# Patient Record
Sex: Female | Born: 1995 | Race: White | Hispanic: No | Marital: Married | Smoking: Never smoker
Health system: Southern US, Community
[De-identification: ages and names within clinical notes are randomized; demographics above are authoritative.]

## PROBLEM LIST (undated history)

## (undated) DIAGNOSIS — M797 Fibromyalgia: Secondary | ICD-10-CM

## (undated) DIAGNOSIS — F419 Anxiety disorder, unspecified: Secondary | ICD-10-CM

## (undated) DIAGNOSIS — R519 Headache, unspecified: Secondary | ICD-10-CM

## (undated) DIAGNOSIS — F32A Depression, unspecified: Secondary | ICD-10-CM

## (undated) HISTORY — DX: Depression, unspecified: F32.A

## (undated) HISTORY — PX: WISDOM TOOTH EXTRACTION: SHX21

## (undated) HISTORY — PX: KNEE SURGERY: SHX244

## (undated) HISTORY — PX: EYE SURGERY: SHX253

## (undated) HISTORY — DX: Anxiety disorder, unspecified: F41.9

---
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
## 2020-12-03 ENCOUNTER — Inpatient Hospital Stay (HOSPITAL_COMMUNITY): Payer: No Typology Code available for payment source

## 2020-12-03 ENCOUNTER — Encounter (HOSPITAL_COMMUNITY): Payer: Self-pay | Admitting: Obstetrics and Gynecology

## 2020-12-03 ENCOUNTER — Other Ambulatory Visit: Payer: Self-pay

## 2020-12-03 ENCOUNTER — Inpatient Hospital Stay (HOSPITAL_COMMUNITY)
Admission: AD | Admit: 2020-12-03 | Discharge: 2020-12-03 | Disposition: A | Discharge status: Home / Self Care | Payer: No Typology Code available for payment source | Attending: Obstetrics and Gynecology | Admitting: Obstetrics and Gynecology

## 2020-12-03 DIAGNOSIS — Z3A01 Less than 8 weeks gestation of pregnancy: Secondary | ICD-10-CM

## 2020-12-03 DIAGNOSIS — O26891 Other specified pregnancy related conditions, first trimester: Secondary | ICD-10-CM | POA: Diagnosis not present

## 2020-12-03 DIAGNOSIS — R1030 Lower abdominal pain, unspecified: Secondary | ICD-10-CM | POA: Diagnosis present

## 2020-12-03 DIAGNOSIS — D72829 Elevated white blood cell count, unspecified: Secondary | ICD-10-CM | POA: Diagnosis not present

## 2020-12-03 DIAGNOSIS — O99111 Other diseases of the blood and blood-forming organs and certain disorders involving the immune mechanism complicating pregnancy, first trimester: Secondary | ICD-10-CM | POA: Insufficient documentation

## 2020-12-03 DIAGNOSIS — O26899 Other specified pregnancy related conditions, unspecified trimester: Secondary | ICD-10-CM

## 2020-12-03 DIAGNOSIS — R109 Unspecified abdominal pain: Secondary | ICD-10-CM

## 2020-12-03 HISTORY — DX: Fibromyalgia: M79.7

## 2020-12-03 LAB — URINALYSIS, ROUTINE W REFLEX MICROSCOPIC
Bilirubin Urine: NEGATIVE
Glucose, UA: NEGATIVE mg/dL
Hgb urine dipstick: NEGATIVE
Ketones, ur: 20 mg/dL — AB
Leukocytes,Ua: NEGATIVE
Nitrite: NEGATIVE
Protein, ur: NEGATIVE mg/dL
Specific Gravity, Urine: 1.003 — ABNORMAL LOW (ref 1.005–1.030)
pH: 6 (ref 5.0–8.0)

## 2020-12-03 LAB — CBC
HCT: 42.3 % (ref 36.0–46.0)
Hemoglobin: 14.5 g/dL (ref 12.0–15.0)
MCH: 30.4 pg (ref 26.0–34.0)
MCHC: 34.3 g/dL (ref 30.0–36.0)
MCV: 88.7 fL (ref 80.0–100.0)
Platelets: 246 10*3/uL (ref 150–400)
RBC: 4.77 MIL/uL (ref 3.87–5.11)
RDW: 12.7 % (ref 11.5–15.5)
WBC: 18.2 10*3/uL — ABNORMAL HIGH (ref 4.0–10.5)
nRBC: 0 % (ref 0.0–0.2)

## 2020-12-03 LAB — WET PREP, GENITAL
Clue Cells Wet Prep HPF POC: NONE SEEN
Sperm: NONE SEEN
Trich, Wet Prep: NONE SEEN
Yeast Wet Prep HPF POC: NONE SEEN

## 2020-12-03 LAB — ABO/RH: ABO/RH(D): A POS

## 2020-12-03 LAB — POCT PREGNANCY, URINE: Preg Test, Ur: POSITIVE — AB

## 2020-12-03 LAB — HCG, QUANTITATIVE, PREGNANCY: hCG, Beta Chain, Quant, S: 9741 m[IU]/mL — ABNORMAL HIGH (ref <5)

## 2020-12-03 NOTE — MAU Provider Note (Signed)
History     CSN: [id number]  Arrival date and time: 12/03/20 1512   Event Date/Time   First Provider Initiated Contact with Patient 12/03/20 1609      Chief Complaint  Patient presents with   Abdominal Pain   Dizziness   HPI 25 yo F G1 at [redacted]w[redacted]d (by LMP of 10/28/2020) with hx of depression (on Zoloft), here after having intermittent lower abdominal pain and cramping without vaginal bleeding throughout the last week. Comes in today after having an episode of severe cramping that also simultaneously led to dizziness and sweating. She denies vaginal bleeding, dysuria, vaginal discharge, trauma. Patient reports currently not having any cramping.   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 No pertinent family history.  Social History   Tobacco Use   Smoking status: Never   Smokeless tobacco: Never  Substance Use Topics   Alcohol use: Not Currently   Drug use: Never    Allergies:  Allergies  Allergen Reactions   Tape Rash    No medications prior to admission.    Review of Systems  Constitutional:  Negative for fever.  Respiratory:  Negative for shortness of breath.   Cardiovascular:  Negative for chest pain.  Gastrointestinal:  Negative for abdominal pain, constipation and diarrhea.  Endocrine: Negative for polyuria.  Genitourinary:  Negative for dysuria.  Musculoskeletal:  Negative for back pain.  Neurological:  Negative for headaches.  Psychiatric/Behavioral:  Negative for agitation.   All other systems reviewed and are negative. Physical Exam   Blood pressure 140/70, pulse 91, temperature 98.1 F (36.7 C), resp. rate 18, height 5\' 2"  (1.575 m), weight 104.3 kg, last menstrual period 10/28/2020.  Physical Exam Vitals and nursing note reviewed.  Constitutional:       Appearance: She is well-developed.  HENT:     Head: Normocephalic.  Cardiovascular:     Rate and Rhythm: Normal rate and regular rhythm.  Pulmonary:     Effort: Pulmonary effort is normal.     Breath sounds: Normal breath sounds.  Abdominal:     General: Abdomen is flat. Bowel sounds are normal. There is no distension.     Palpations: Abdomen is soft.     Comments: No TTP  Skin:    General: Skin is warm.     Capillary Refill: Capillary refill takes less than 2 seconds.  Neurological:     General: No focal deficit present.     Mental Status: She is alert.    MAU Course  MDM 25 yo G1 at [facility] by LMP who presents for intermittent abdominal cramping, NO vaginal bleeding, and one episode of dizziness and diaphoresis while having cramping. Will plan to evaluate and confirm IUP and rule out ectopic. Other differentials include vaginal infection and UTI. Patient currently asymptomatic and hemodynamically stable, mildly hypertensive (140/70), HR of 91, and normal respiratory rate - [facility] OB less than 14 weeks - CBC - RH screen - Swabs for infection: GC/CT, wet mount - UA and urine culture - deferred speculum exam at this time given no bleeding  1730 Labs back, Wet prep without yeast, trich, or clue cells, blood type A+, UA without signs of infection, CBC with isolated elevated WBC (18.2) without any other abnormality.  [place] images reviewed by me and official US report with single intrauterine gestational sac 5wk4days, + YS, no fetal pole at this time.   6:31 PM HCG 9741 which is appropriate for 5 weeks of pregnancy (ref range of 3500-115000)   Assessment and Plan   Abdominal pain in early pregnancy Patient with intermittent abdominal cramping without bleeding. [place] wih single intrauterine gestational sac 5wk4days, + YS, no fetal pole at this time confirming intrauterine pregnancy. HCG is 9741 appropriate for [redacted] weeks gestation (ref 3500-115,000). No evidence of ectopic at this time and  will plan for follow up with community OB (patient already has appointment on 12/19/2020). - message sent to community OB practice regarding visit and plan for repeat US at time of visit   2. Isolated leukocytosis Patient with increased WBC without evidence of infection and also with normal vital signs and all other cell lines normal - Plan for follow up CBC outpatient (included information in message to outpatient provider)  Warner Mccreedy, MD, MPH OB Fellow, Faculty Practice

## 2020-12-03 NOTE — Discharge Instructions (Signed)
You came to the hospital because you had abdominal cramping in pregnancy. We did an ultrasound that showed your pregnancy is in the uterus and you are about 5wks 4 days pregnancy. You will need a repeat ultrasound in 7-10 days. We also collected some swabs and Urine tests and someone will call you if there is anything abnormal.  Please go to your prenatal appointment on 8/12

## 2020-12-03 NOTE — MAU Note (Signed)
Pt presents with cramping (7/10) and dizziness. Denies VB. Pt states she had LMP 6/21. Positive HPT. Unable to urinate at this time. Will try again. Gave instructions to let registration know when she has sample.

## 2020-12-03 NOTE — MAU Note (Signed)
Reports +UPT last week. Started having abd cramping on and off for about a week. Today around 1:30pm she stared feeling dizzy and light headed and sweaty.  Called MD and told to come in.  Feels better now but still having cramping on and off. Denies any vag bleeding or discharge.

## 2020-12-04 LAB — GC/CHLAMYDIA PROBE AMP (~~LOC~~) NOT AT ARMC
Chlamydia: NEGATIVE
Comment: NEGATIVE
Comment: NORMAL
Neisseria Gonorrhea: NEGATIVE

## 2020-12-04 LAB — CULTURE, OB URINE: Culture: <10000 — AB

## 2020-12-25 LAB — OB RESULTS CONSOLE GC/CHLAMYDIA
Chlamydia: NEGATIVE
Gonorrhea: NEGATIVE

## 2020-12-25 LAB — OB RESULTS CONSOLE HIV ANTIBODY (ROUTINE TESTING): HIV: NONREACTIVE

## 2020-12-25 LAB — OB RESULTS CONSOLE ABO/RH: RH Type: POSITIVE

## 2020-12-25 LAB — HEPATITIS C ANTIBODY: HCV Ab: NEGATIVE

## 2020-12-25 LAB — OB RESULTS CONSOLE ANTIBODY SCREEN: Antibody Screen: NEGATIVE

## 2020-12-25 LAB — OB RESULTS CONSOLE RUBELLA ANTIBODY, IGM: Rubella: IMMUNE

## 2020-12-25 LAB — OB RESULTS CONSOLE HEPATITIS B SURFACE ANTIGEN: Hepatitis B Surface Ag: NEGATIVE

## 2020-12-25 LAB — OB RESULTS CONSOLE RPR: RPR: NONREACTIVE

## 2021-02-17 ENCOUNTER — Inpatient Hospital Stay (HOSPITAL_COMMUNITY)
Admission: AD | Admit: 2021-02-17 | Discharge: 2021-02-17 | Disposition: A | Discharge status: Home / Self Care | Payer: No Typology Code available for payment source | Attending: Obstetrics and Gynecology | Admitting: Obstetrics and Gynecology

## 2021-02-17 ENCOUNTER — Other Ambulatory Visit: Payer: Self-pay

## 2021-02-17 ENCOUNTER — Encounter (HOSPITAL_COMMUNITY): Payer: Self-pay | Admitting: Obstetrics and Gynecology

## 2021-02-17 DIAGNOSIS — O219 Vomiting of pregnancy, unspecified: Secondary | ICD-10-CM

## 2021-02-17 DIAGNOSIS — R197 Diarrhea, unspecified: Secondary | ICD-10-CM | POA: Insufficient documentation

## 2021-02-17 DIAGNOSIS — O210 Mild hyperemesis gravidarum: Secondary | ICD-10-CM | POA: Diagnosis present

## 2021-02-17 DIAGNOSIS — O99891 Other specified diseases and conditions complicating pregnancy: Secondary | ICD-10-CM

## 2021-02-17 DIAGNOSIS — Z3A16 16 weeks gestation of pregnancy: Secondary | ICD-10-CM

## 2021-02-17 DIAGNOSIS — E860 Dehydration: Secondary | ICD-10-CM

## 2021-02-17 DIAGNOSIS — R112 Nausea with vomiting, unspecified: Secondary | ICD-10-CM

## 2021-02-17 LAB — URINALYSIS, ROUTINE W REFLEX MICROSCOPIC
Bilirubin Urine: NEGATIVE
Glucose, UA: NEGATIVE mg/dL
Ketones, ur: 80 mg/dL — AB
Nitrite: NEGATIVE
Protein, ur: NEGATIVE mg/dL
Specific Gravity, Urine: 1.015 (ref 1.005–1.030)
pH: 6 (ref 5.0–8.0)

## 2021-02-17 LAB — CBC
HCT: 43.3 % (ref 36.0–46.0)
Hemoglobin: 14.4 g/dL (ref 12.0–15.0)
MCH: 30.1 pg (ref 26.0–34.0)
MCHC: 33.3 g/dL (ref 30.0–36.0)
MCV: 90.4 fL (ref 80.0–100.0)
Platelets: 239 10*3/uL (ref 150–400)
RBC: 4.79 MIL/uL (ref 3.87–5.11)
RDW: 12.7 % (ref 11.5–15.5)
WBC: 20.7 10*3/uL — ABNORMAL HIGH (ref 4.0–10.5)
nRBC: 0 % (ref 0.0–0.2)

## 2021-02-17 LAB — COMPREHENSIVE METABOLIC PANEL
ALT: 17 U/L (ref 0–44)
AST: 19 U/L (ref 15–41)
Albumin: 3.3 g/dL — ABNORMAL LOW (ref 3.5–5.0)
Alkaline Phosphatase: 62 U/L (ref 38–126)
Anion gap: 12 (ref 5–15)
BUN: 9 mg/dL (ref 6–20)
CO2: 22 mmol/L (ref 22–32)
Calcium: 9.3 mg/dL (ref 8.9–10.3)
Chloride: 99 mmol/L (ref 98–111)
Creatinine, Ser: 0.45 mg/dL (ref 0.44–1.00)
GFR, Estimated: >60 mL/min (ref >60)
Glucose, Bld: 90 mg/dL (ref 70–99)
Potassium: 3.7 mmol/L (ref 3.5–5.1)
Sodium: 133 mmol/L — ABNORMAL LOW (ref 135–145)
Total Bilirubin: 0.6 mg/dL (ref 0.3–1.2)
Total Protein: 6.7 g/dL (ref 6.5–8.1)

## 2021-02-17 LAB — LIPASE, BLOOD: Lipase: 31 U/L (ref 11–51)

## 2021-02-17 MED ORDER — LACTATED RINGERS IV BOLUS
1000.0000 mL | Freq: Once | INTRAVENOUS | Status: AC
  Administered 2021-02-17: 1000 mL via INTRAVENOUS

## 2021-02-17 MED ORDER — PROMETHAZINE HCL 25 MG PO TABS: 25.0000 mg | ORAL_TABLET | Freq: Four times a day (QID) | ORAL | 0 refills | Status: AC | PRN

## 2021-02-17 MED ORDER — FAMOTIDINE IN NACL 20-0.9 MG/50ML-% IV SOLN
20.0000 mg | Freq: Once | INTRAVENOUS | Status: AC
  Administered 2021-02-17: 20 mg via INTRAVENOUS
  Filled 2021-02-17: qty 50

## 2021-02-17 MED ORDER — SODIUM CHLORIDE 0.9 % IV SOLN
8.0000 mg | Freq: Once | INTRAVENOUS | Status: AC
  Administered 2021-02-17: 8 mg via INTRAVENOUS
  Filled 2021-02-17: qty 4

## 2021-02-17 NOTE — MAU Provider Note (Addendum)
History     CSN: [id number]  Arrival date and time: 02/17/21 1842  25 y.o. G1 @16 .0 wks presenting with N/V. Reports onset 3 days ago. She had been using but it stopped working. Denies sick contacts or fever. Had diarrhea with sx onset but none since. Denies abdominal pain or VB.    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 No pertinent family history.  Social History   Tobacco Use   Smoking status: Never   Smokeless tobacco: Never  Substance Use Topics   Alcohol use: Not Currently   Drug use: Never    Allergies:  Allergies  Allergen Reactions   Tape Rash    Medications Prior to Admission  Medication Sig Dispense Refill Last Dose   sertraline (ZOLOFT) 50 MG tablet Take 50 mg by mouth daily.   02/17/2021    Review of Systems  Constitutional:  Negative for chills and fever.  Gastrointestinal:  Positive for nausea and vomiting. Negative for abdominal pain.  Genitourinary:  Negative for dysuria, frequency, hematuria, urgency and vaginal bleeding.  Musculoskeletal:  Negative for back pain.  Physical Exam   Blood pressure 128/79, pulse 100, temperature 98.3 F (36.8 C), resp. rate 17, height 5\' 2"  (1.575 m), weight 103.7 kg, last menstrual period 10/28/2020, SpO2 100 %.  Physical Exam Vitals and nursing note reviewed.  Constitutional:      General: She is not in acute distress.    Appearance: Normal appearance.  HENT:     Head: Normocephalic and atraumatic.  Cardiovascular:     Rate and Rhythm: Normal rate.  Pulmonary:     Effort: Pulmonary effort is normal. No respiratory  distress.  Musculoskeletal:        General: Normal range of motion.     Cervical back: Normal range of motion.  Neurological:     General: No focal deficit present.     Mental Status: She is alert and oriented to person, place, and time.  Psychiatric:        Mood and Affect: Mood normal.        Behavior: Behavior normal.  FHT 145  Results for orders placed or performed during the hospital encounter of 02/17/21 (from the past 24 hour(s))  Urinalysis, Routine w reflex microscopic Urine, Clean Catch     Status: Abnormal   Collection Time: 02/17/21  7:05 PM  Result Value Ref Range   Color, Urine YELLOW YELLOW   APPearance HAZY (A) CLEAR   Specific Gravity, Urine 1.015 1.005 - 1.030   pH 6.0 5.0 - 8.0   Glucose, UA NEGATIVE NEGATIVE mg/dL   Hgb urine dipstick SMALL (A) NEGATIVE   Bilirubin Urine NEGATIVE NEGATIVE   Ketones, ur 80 (A) NEGATIVE mg/dL   Protein, ur  NEGATIVE NEGATIVE mg/dL   Nitrite NEGATIVE NEGATIVE   Leukocytes,Ua LARGE (A) NEGATIVE   RBC / HPF 0-5 0 - 5 RBC/hpf   WBC, UA 6-10 0 - 5 WBC/hpf   Bacteria, UA FEW (A) NONE SEEN   Squamous Epithelial / LPF 6-10 0 - 5   Mucus PRESENT   CBC     Status: Abnormal   Collection Time: 02/17/21  8:30 PM  Result Value Ref Range   WBC 20.7 (H) 4.0 - 10.5 K/uL   RBC 4.79 3.87 - 5.11 MIL/uL   Hemoglobin 14.4 12.0 - 15.0 g/dL   HCT 05.3 97.6 - 73.4 %   MCV 90.4 80.0 - 100.0 fL   MCH 30.1 26.0 - 34.0 pg   MCHC 33.3 30.0 - 36.0 g/dL   RDW 19.3 79.0 - 24.0 %   Platelets 239 150 - 400 K/uL   nRBC 0.0 0.0 - 0.2 %  Comprehensive metabolic panel     Status: Abnormal   Collection Time: 02/17/21  8:30 PM  Result Value Ref Range   Sodium 133 (L) 135 - 145 mmol/L   Potassium 3.7 3.5 - 5.1 mmol/L   Chloride 99 98 - 111 mmol/L   CO2 22 22 - 32 mmol/L   Glucose, Bld 90 70 - 99 mg/dL   BUN 9 6 - 20 mg/dL   Creatinine, Ser 9.73 0.44 - 1.00 mg/dL   Calcium 9.3 8.9 - 53.2 mg/dL   Total Protein 6.7 6.5 - 8.1 g/dL   Albumin 3.3 (L) 3.5 -  5.0 g/dL   AST 19 15 - 41 U/L   ALT 17 0 - 44 U/L   Alkaline Phosphatase 62 38 - 126 U/L   Total Bilirubin 0.6 0.3 - 1.2 mg/dL   GFR, Estimated >99 >24 mL/min   Anion gap 12 5 - 15  Lipase, blood     Status: None   Collection Time: 02/17/21  8:30 PM  Result Value Ref Range   Lipase 31 11 - 51 U/L   MAU Course  Procedures LR Zofran Pepcid  MDM Prenatal records reviewed: pregnancy complicated by chronic HAs, fibromyalgia, and depression. Labs ordered and reviewed. Elevated WBCs, may have GI virus although no longer having diarrhea.  Transfer of care given to Lorenda Peck, CNM  02/17/2021 10:09 PM   2230hrs:   States feels better and wants to go home  Assessment and Plan  A:  Single IUP at [redacted]w[redacted]d       Nausea/vomiting       Diarrhea  P:   Discharge home        Rx Phenergan for nausea        Advance diet as tolerated       Followup in office        Encouraged to return if she develops worsening of symptoms, increase in pain, fever, or other concerning symptoms.   Aviva Signs, CNM

## 2021-02-17 NOTE — MAU Note (Signed)
.  [name] is a 25 y.o. at [redacted]w[redacted]d here in MAU reporting: n/v since Saturday. States she has not been able to keep anything down. Denies pain. Takes Bonjesta.   Vitals:   02/17/21 1854  BP: 128/87  Pulse: 96  Resp: 17  Temp: 98.3 F (36.8 C)  SpO2: 97%     FHT:145 Lab orders placed from triage:  UA

## 2021-02-19 LAB — CULTURE, OB URINE

## 2021-05-14 ENCOUNTER — Inpatient Hospital Stay (HOSPITAL_COMMUNITY)
Admission: AD | Admit: 2021-05-14 | Discharge: 2021-05-14 | Disposition: A | Discharge status: Home / Self Care | Payer: No Typology Code available for payment source | Attending: Obstetrics and Gynecology | Admitting: Obstetrics and Gynecology

## 2021-05-14 ENCOUNTER — Encounter (HOSPITAL_COMMUNITY): Payer: Self-pay | Admitting: Obstetrics and Gynecology

## 2021-05-14 ENCOUNTER — Other Ambulatory Visit: Payer: Self-pay

## 2021-05-14 DIAGNOSIS — Z3689 Encounter for other specified antenatal screening: Secondary | ICD-10-CM | POA: Diagnosis present

## 2021-05-14 DIAGNOSIS — N898 Other specified noninflammatory disorders of vagina: Secondary | ICD-10-CM

## 2021-05-14 DIAGNOSIS — Z3A28 28 weeks gestation of pregnancy: Secondary | ICD-10-CM

## 2021-05-14 DIAGNOSIS — O26893 Other specified pregnancy related conditions, third trimester: Secondary | ICD-10-CM | POA: Diagnosis not present

## 2021-05-14 LAB — URINALYSIS, ROUTINE W REFLEX MICROSCOPIC
Bilirubin Urine: NEGATIVE
Glucose, UA: NEGATIVE mg/dL
Hgb urine dipstick: NEGATIVE
Ketones, ur: NEGATIVE mg/dL
Leukocytes,Ua: NEGATIVE
Nitrite: NEGATIVE
Protein, ur: NEGATIVE mg/dL
Specific Gravity, Urine: 1.029 (ref 1.005–1.030)
pH: 6 (ref 5.0–8.0)

## 2021-05-14 LAB — WET PREP, GENITAL
Clue Cells Wet Prep HPF POC: NONE SEEN
Sperm: NONE SEEN
Trich, Wet Prep: NONE SEEN
WBC, Wet Prep HPF POC: <10 (ref <10)
Yeast Wet Prep HPF POC: NONE SEEN

## 2021-05-14 LAB — POCT FERN TEST: POCT Fern Test: NEGATIVE

## 2021-05-14 NOTE — MAU Note (Signed)
About 1745 I felt the baby move and I leaked some fld. I have not leaked anymore since then. Mild cramping in lower abd. Good FM

## 2021-05-14 NOTE — MAU Provider Note (Signed)
History   [id number]   Chief Complaint  Patient presents with   Vaginal Discharge    HPI Kristine Elliott is a 26 y.o. female  G1P0 @28 .2 wks here with report of leaking fluid around 1745 after she felt the baby move.  Leaking of fluid has not continued. Denies vaginal itching or malodor. Was treated for a yeast infection in November. Pt reports no contractions. She denies vaginal bleeding. Last intercourse was not recent. She reports good fetal movement. All other systems negative.    Patient's last menstrual period was 10/28/2020.  OB History  Gravida Para Term Preterm AB Living  1            SAB IAB Ectopic Multiple Live Births               # Outcome Date GA Lbr Len/2nd Weight Sex Delivery Anes PTL Lv  1 Current             Past Medical History:  Diagnosis Date   Fibromyalgia     History reviewed. No pertinent family history.  Social History   Socioeconomic History   Marital status: Married    Spouse name: Not on file   Number of children: Not on file   Years of education: Not on file   Highest education level: Not on file  Occupational History   Not on file  Tobacco Use   Smoking status: Never   Smokeless tobacco: Never  Substance and Sexual Activity   Alcohol use: Not Currently   Drug use: Never   Sexual activity: Yes    Birth control/protection: None  Other Topics Concern   Not on file  Social History Narrative   Not on file   Social Determinants of Health   Financial Resource Strain: Not on file  Food Insecurity: Not on file  Transportation Needs: Not on file  Physical Activity: Not on file  Stress: Not on file  Social Connections: Not on file    Allergies  Allergen Reactions   Tape Rash    No current facility-administered medications on file prior to encounter.   Current Outpatient Medications on File Prior to Encounter  Medication Sig Dispense Refill   Prenatal Vit-Fe Fumarate-FA (MULTIVITAMIN-PRENATAL) 27-0.8 MG TABS tablet Take 1  tablet by mouth daily at 12 noon.     promethazine (PHENERGAN) 25 MG tablet Take 1 tablet (25 mg total) by mouth every 6 (six) hours as needed for nausea or vomiting. 30 tablet 0   sertraline (ZOLOFT) 50 MG tablet Take 50 mg by mouth daily.       Review of Systems  Gastrointestinal:  Negative for abdominal pain.  Genitourinary:  Positive for vaginal discharge. Negative for vaginal bleeding.    Physical Exam   Vitals:   05/14/21 1941 05/14/21 1943 05/14/21 2015 05/14/21 2035  BP:  124/87 121/75   Pulse: 88  (!) 108   Resp: 18  18   Temp: 98.2 F (36.8 C)   97.9 F (36.6 C)  TempSrc:    Oral  SpO2: 99%  98%   Weight: 108 kg     Height: 5\' 2"  (1.575 m)       Physical Exam Vitals and nursing note reviewed. Exam conducted with a chaperone present.  Constitutional:      General: She is not in acute distress.    Appearance: Normal appearance.  HENT:     Head: Normocephalic and atraumatic.  Pulmonary:     Effort: Pulmonary effort is  normal. No respiratory distress.  Abdominal:     Palpations: Abdomen is soft.     Tenderness: There is no abdominal tenderness.  Genitourinary:    Comments: SSE: no pool, thick curdy discharge  Musculoskeletal:        General: Normal range of motion.     Cervical back: Normal range of motion.  Skin:    General: Skin is warm and dry.  Neurological:     General: No focal deficit present.     Mental Status: She is alert and oriented to person, place, and time.  Psychiatric:        Mood and Affect: Mood normal.        Behavior: Behavior normal.  EFM: 130 bpm, mod variability, + accels, variable decels Toco: none  Results for orders placed or performed during the hospital encounter of 05/14/21 (from the past 24 hour(s))  Wet prep, genital     Status: None   Collection Time: 05/14/21  7:27 PM  Result Value Ref Range   Yeast Wet Prep HPF POC NONE SEEN NONE SEEN   Trich, Wet Prep NONE SEEN NONE SEEN   Clue Cells Wet Prep HPF POC NONE SEEN  NONE SEEN   WBC, Wet Prep HPF POC <10 <10   Sperm NONE SEEN   Urinalysis, Routine w reflex microscopic Urine, Clean Catch     Status: Abnormal   Collection Time: 05/14/21  7:50 PM  Result Value Ref Range   Color, Urine AMBER (A) YELLOW   APPearance HAZY (A) CLEAR   Specific Gravity, Urine 1.029 1.005 - 1.030   pH 6.0 5.0 - 8.0   Glucose, UA NEGATIVE NEGATIVE mg/dL   Hgb urine dipstick NEGATIVE NEGATIVE   Bilirubin Urine NEGATIVE NEGATIVE   Ketones, ur NEGATIVE NEGATIVE mg/dL   Protein, ur NEGATIVE NEGATIVE mg/dL   Nitrite NEGATIVE NEGATIVE   Leukocytes,Ua NEGATIVE NEGATIVE  Fern Test     Status: None   Collection Time: 05/14/21  9:50 PM  Result Value Ref Range   POCT Fern Test Negative = intact amniotic membranes    MAU Course  Procedures  MDM Prenatal records reviewed. Labs ordered and reviewed. Had ~12 min episode of maternal tachycardia w/HR 120-140, felt lightheaded, no syncope. RN had pt take some slow deep breaths and HR normalized. Shortly after return to baseline FHR had a few variables. IVF started and pt repositioned to left lateral.  Prolonged monitoring was reassuring and no further episodes of maternal tachycardia or FHR decels. Pt feels well. No signs of SROM. Stable for discharge home.  Assessment and Plan   1. [redacted] weeks gestation of pregnancy   2. NST (non-stress test) reactive   3. Vaginal discharge during pregnancy in third trimester    Discharge home Follow up at [facility] as scheduled PTL precautions  Allergies as of 05/14/2021       Reactions   Tape Rash        Medication List     TAKE these medications    multivitamin-prenatal 27-0.8 MG Tabs tablet Take 1 tablet by mouth daily at 12 noon.   promethazine 25 MG tablet Commonly known as: PHENERGAN Take 1 tablet (25 mg total) by mouth every 6 (six) hours as needed for nausea or vomiting.   sertraline 50 MG tablet Commonly known as: ZOLOFT Take 50 mg by mouth daily.        Julianne Handler, CNM 05/14/2021 9:11 PM

## 2021-05-15 LAB — GC/CHLAMYDIA PROBE AMP (~~LOC~~) NOT AT ARMC
Chlamydia: NEGATIVE
Comment: NEGATIVE
Comment: NORMAL
Neisseria Gonorrhea: NEGATIVE

## 2021-07-08 LAB — OB RESULTS CONSOLE GBS: GBS: NEGATIVE

## 2021-07-23 ENCOUNTER — Telehealth (HOSPITAL_COMMUNITY): Payer: Self-pay | Admitting: *Deleted

## 2021-07-23 ENCOUNTER — Encounter (HOSPITAL_COMMUNITY): Payer: Self-pay | Admitting: *Deleted

## 2021-07-23 NOTE — Telephone Encounter (Signed)
Preadmission screen  

## 2021-07-27 ENCOUNTER — Telehealth (HOSPITAL_COMMUNITY): Payer: Self-pay | Admitting: *Deleted

## 2021-07-27 ENCOUNTER — Other Ambulatory Visit: Payer: Self-pay | Admitting: Obstetrics & Gynecology

## 2021-07-27 ENCOUNTER — Encounter (HOSPITAL_COMMUNITY): Payer: Self-pay | Admitting: Obstetrics & Gynecology

## 2021-07-27 NOTE — Telephone Encounter (Signed)
Preadmission screen  

## 2021-07-28 ENCOUNTER — Inpatient Hospital Stay (HOSPITAL_COMMUNITY): Payer: No Typology Code available for payment source | Admitting: Anesthesiology

## 2021-07-28 ENCOUNTER — Other Ambulatory Visit: Payer: Self-pay

## 2021-07-28 ENCOUNTER — Encounter (HOSPITAL_COMMUNITY): Payer: Self-pay | Admitting: Obstetrics and Gynecology

## 2021-07-28 ENCOUNTER — Inpatient Hospital Stay (HOSPITAL_COMMUNITY)
Admission: AD | Admit: 2021-07-28 | Discharge: 2021-07-31 | DRG: 787 | Disposition: A | Discharge status: Home / Self Care | Payer: No Typology Code available for payment source | Attending: Obstetrics and Gynecology | Admitting: Obstetrics and Gynecology

## 2021-07-28 DIAGNOSIS — O99214 Obesity complicating childbirth: Secondary | ICD-10-CM | POA: Diagnosis present

## 2021-07-28 DIAGNOSIS — F411 Generalized anxiety disorder: Secondary | ICD-10-CM | POA: Diagnosis present

## 2021-07-28 DIAGNOSIS — O3663X0 Maternal care for excessive fetal growth, third trimester, not applicable or unspecified: Secondary | ICD-10-CM | POA: Diagnosis present

## 2021-07-28 DIAGNOSIS — O4292 Full-term premature rupture of membranes, unspecified as to length of time between rupture and onset of labor: Principal | ICD-10-CM | POA: Diagnosis present

## 2021-07-28 DIAGNOSIS — O99344 Other mental disorders complicating childbirth: Secondary | ICD-10-CM | POA: Diagnosis present

## 2021-07-28 DIAGNOSIS — O358XX0 Maternal care for other (suspected) fetal abnormality and damage, not applicable or unspecified: Secondary | ICD-10-CM | POA: Diagnosis present

## 2021-07-28 DIAGNOSIS — Z3A39 39 weeks gestation of pregnancy: Secondary | ICD-10-CM

## 2021-07-28 DIAGNOSIS — O9912 Other diseases of the blood and blood-forming organs and certain disorders involving the immune mechanism complicating childbirth: Secondary | ICD-10-CM | POA: Diagnosis present

## 2021-07-28 DIAGNOSIS — D72829 Elevated white blood cell count, unspecified: Secondary | ICD-10-CM | POA: Diagnosis present

## 2021-07-28 LAB — CBC
HCT: 35.3 % — ABNORMAL LOW (ref 36.0–46.0)
Hemoglobin: 11.3 g/dL — ABNORMAL LOW (ref 12.0–15.0)
MCH: 25.6 pg — ABNORMAL LOW (ref 26.0–34.0)
MCHC: 32 g/dL (ref 30.0–36.0)
MCV: 80 fL (ref 80.0–100.0)
Platelets: 266 10*3/uL (ref 150–400)
RBC: 4.41 MIL/uL (ref 3.87–5.11)
RDW: 14.3 % (ref 11.5–15.5)
WBC: 15.4 10*3/uL — ABNORMAL HIGH (ref 4.0–10.5)
nRBC: 0 % (ref 0.0–0.2)

## 2021-07-28 LAB — URINALYSIS, ROUTINE W REFLEX MICROSCOPIC
Bilirubin Urine: NEGATIVE
Glucose, UA: NEGATIVE mg/dL
Ketones, ur: NEGATIVE mg/dL
Nitrite: NEGATIVE
Protein, ur: NEGATIVE mg/dL
Specific Gravity, Urine: 1.024 (ref 1.005–1.030)
pH: 6 (ref 5.0–8.0)

## 2021-07-28 LAB — TYPE AND SCREEN
ABO/RH(D): A POS
Antibody Screen: NEGATIVE

## 2021-07-28 LAB — RPR: RPR Ser Ql: NONREACTIVE

## 2021-07-28 LAB — POCT FERN TEST: POCT Fern Test: POSITIVE

## 2021-07-28 MED ORDER — EPHEDRINE 5 MG/ML INJ: 10.0000 mg | INTRAVENOUS | Status: DC | PRN

## 2021-07-28 MED ORDER — LIDOCAINE HCL (PF) 1 % IJ SOLN: 30.0000 mL | INTRAMUSCULAR | Status: DC | PRN

## 2021-07-28 MED ORDER — FENTANYL-BUPIVACAINE-NACL 0.5-0.125-0.9 MG/250ML-% EP SOLN
12.0000 mL/h | EPIDURAL | Status: DC | PRN
  Administered 2021-07-28: 12 mL/h via EPIDURAL
  Filled 2021-07-28: qty 250

## 2021-07-28 MED ORDER — SERTRALINE HCL 100 MG PO TABS
100.0000 mg | ORAL_TABLET | Freq: Every day | ORAL | Status: DC
  Administered 2021-07-28: 100 mg via ORAL
  Filled 2021-07-28 (×2): qty 1

## 2021-07-28 MED ORDER — OXYTOCIN-SODIUM CHLORIDE 30-0.9 UT/500ML-% IV SOLN
1.0000 m[IU]/min | INTRAVENOUS | Status: DC
  Administered 2021-07-28: 2 m[IU]/min via INTRAVENOUS
  Filled 2021-07-28: qty 500

## 2021-07-28 MED ORDER — OXYCODONE-ACETAMINOPHEN 5-325 MG PO TABS: 1.0000 | ORAL_TABLET | ORAL | Status: DC | PRN

## 2021-07-28 MED ORDER — FENTANYL CITRATE (PF) 100 MCG/2ML IJ SOLN
100.0000 ug | INTRAMUSCULAR | Status: DC | PRN
  Administered 2021-07-28 (×2): 100 ug via INTRAVENOUS
  Filled 2021-07-28 (×2): qty 2

## 2021-07-28 MED ORDER — FLEET ENEMA 7-19 GM/118ML RE ENEM: 1.0000 | ENEMA | RECTAL | Status: DC | PRN

## 2021-07-28 MED ORDER — LACTATED RINGERS IV SOLN: 500.0000 mL | INTRAVENOUS | Status: DC | PRN

## 2021-07-28 MED ORDER — ONDANSETRON HCL 4 MG/2ML IJ SOLN: 4.0000 mg | Freq: Four times a day (QID) | INTRAMUSCULAR | Status: DC | PRN

## 2021-07-28 MED ORDER — PHENYLEPHRINE 40 MCG/ML (10ML) SYRINGE FOR IV PUSH (FOR BLOOD PRESSURE SUPPORT): 80.0000 ug | PREFILLED_SYRINGE | INTRAVENOUS | Status: DC | PRN

## 2021-07-28 MED ORDER — LACTATED RINGERS IV SOLN
500.0000 mL | Freq: Once | INTRAVENOUS | Status: AC
  Administered 2021-07-28: 500 mL via INTRAVENOUS

## 2021-07-28 MED ORDER — SOD CITRATE-CITRIC ACID 500-334 MG/5ML PO SOLN
30.0000 mL | ORAL | Status: DC | PRN
  Administered 2021-07-29: 30 mL via ORAL
  Filled 2021-07-28: qty 30

## 2021-07-28 MED ORDER — OXYTOCIN-SODIUM CHLORIDE 30-0.9 UT/500ML-% IV SOLN: 2.5000 [IU]/h | INTRAVENOUS | Status: DC

## 2021-07-28 MED ORDER — DIPHENHYDRAMINE HCL 50 MG/ML IJ SOLN: 12.5000 mg | INTRAMUSCULAR | Status: DC | PRN

## 2021-07-28 MED ORDER — LACTATED RINGERS IV SOLN: INTRAVENOUS | Status: DC

## 2021-07-28 MED ORDER — ACETAMINOPHEN 325 MG PO TABS
650.0000 mg | ORAL_TABLET | ORAL | Status: DC | PRN
  Administered 2021-07-28: 650 mg via ORAL
  Filled 2021-07-28: qty 2

## 2021-07-28 MED ORDER — OXYCODONE-ACETAMINOPHEN 5-325 MG PO TABS: 2.0000 | ORAL_TABLET | ORAL | Status: DC | PRN

## 2021-07-28 MED ORDER — LIDOCAINE HCL (PF) 1 % IJ SOLN
INTRAMUSCULAR | Status: DC | PRN
  Administered 2021-07-28 (×2): 5 mL via EPIDURAL

## 2021-07-28 MED ORDER — TERBUTALINE SULFATE 1 MG/ML IJ SOLN: 0.2500 mg | Freq: Once | INTRAMUSCULAR | Status: DC | PRN

## 2021-07-28 MED ORDER — OXYTOCIN BOLUS FROM INFUSION: 333.0000 mL | Freq: Once | INTRAVENOUS | Status: DC

## 2021-07-28 NOTE — H&P (Addendum)
Kristine Elliott is a 26 y.o. female presenting for PROM clear fluid.  Scheduled for elective IOL tonight.  [place] last week EFW 8+12#.  GBS neg.  Not diabetic.  Left renal pylectasis (UTD 2) ? ?OB History   ? ? Gravida  ?1  ? Para  ?   ? Term  ?   ? Preterm  ?   ? AB  ?   ? Living  ?   ?  ? ? SAB  ?   ? IAB  ?   ? Ectopic  ?   ? Multiple  ?   ? Live Births  ?   ?   ?  ?  ? ?Past Medical History:  ?Diagnosis Date  ? Anxiety   ? Depression   ? Fibromyalgia   ? Headache   ? ?Past Surgical History:  ?Procedure Laterality Date  ? EYE SURGERY  1999  ? 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 KNEE SURGERY Left 2013  ? WISDOM TOOTH EXTRACTION    ? ?Family History: family history includes Diabetes in her father; Hyperlipidemia in her father; Hypertension in her father; Thrombosis in her maternal grandmother and paternal grandfather. ?Social History:  reports that she has never smoked. She has never used smokeless tobacco. She reports that she does not currently use alcohol. She reports that she does not use drugs. ? ? ?  ?Maternal Diabetes: No ?Genetic Screening: Normal ?Maternal Ultrasounds/Referrals: Fetal Kidney Anomalies normal kidney but Left UTD 2 ?Fetal Ultrasounds or other Referrals:  None ?Maternal Substance Abuse:  No ?Significant Maternal Medications:  None ?Significant Maternal Lab Results:  Group B Strep negative ?Other Comments:  None ? ?Review of Systems ?History ?Dilation: 1 ?Effacement (%): 50 ?Station: -3 ?Exam by:: Joylene Igo RN ?Blood pressure 128/77, pulse 81, temperature 98.2 ?F (36.8 ?C), temperature source Oral, resp. rate 18, height 5\' 2"  (1.575 m), weight 116.2 kg, last menstrual period 10/28/2020, SpO2 99 %. ?Exam ?Physical Exam  ?Vitals and nursing note reviewed. Exam conducted with a chaperone present.  ?Constitutional:   ?   Appearance: Normal appearance.  ?HENT:  ?   Head: Normocephalic.  ?Eyes:  ?   Pupils: Pupils are equal, round, and reactive to light.   ?Cardiovascular:  ?   Rate and Rhythm: Normal rate and regular rhythm.  ?   Pulses: Normal pulses.  ?Abdominal:  ?   General: Abdomen is Gravid, nontender ?Neurological:  ?   Mental Status: She is alert.  ?Prenatal labs: ?ABO, Rh: --/--/A POS (03/21 [id number]) ?Antibody: NEG (03/21 0751) ?Rubella: Immune (08/18 0000) ?RPR: Nonreactive (08/18 0000)  ?HBsAg: Negative (08/18 0000)  ?HIV: Non-reactive (08/18 0000)  ?GBS: Negative/-- (03/01 0000)  ? ?Assessment/Plan: ?IUP at term ?PROM - Augment with pitocin.  Anticipate SVD ?Macrosomia by last week US/ no diabetes.  Will  monitor progression in labor  ? ?Luz Lex ?07/28/2021, 11:21 AM ? ? ? ? ?

## 2021-07-28 NOTE — Progress Notes (Signed)
Kristine Elliott is a 26 y.o. G1P0 at [redacted]w[redacted]d by LMP admitted for rupture of membranes ? ?Subjective: ? ? ?Objective: ?BP 114/63   Pulse 68   Temp 98.4 ?F (36.9 ?C) (Oral)   Resp 18   Ht 5\' 2"  (1.575 m)   Wt 116.2 kg   LMP 10/28/2020   SpO2 99%   BMI 46.86 kg/m?  ?No intake/output data recorded. ?No intake/output data recorded. ? ?FHT:  FHR: 140 bpm, variability: moderate,  accelerations:  Present,  decelerations:  Absent ?UC:   regular, every 2 minutes ?SVE:   Dilation: 4 ?Effacement (%): 80 ?Station: -2 ?Exam by:: [email or web address], MD ? ?Labs: ?Lab Results  ?Component Value Date  ? WBC 15.4 (H) 07/28/2021  ? HGB 11.3 (L) 07/28/2021  ? HCT 35.3 (L) 07/28/2021  ? MCV 80.0 07/28/2021  ? PLT 266 07/28/2021  ? ? ?Assessment / Plan: ?Arrest in active phase of labor ? ?Labor:  adequate labor pattern and by IUPC for 6 plus hours ?Preeclampsia:  no signs or symptoms of toxicity ?Fetal Wellbeing:  Category I ?Pain Control:  Epidural ?I/D:  n/a ?Anticipated MOD:   I recommend primary cesarean section based on arrest of dilation for 4 hours and protracted labor previously .  Also, Macrosomia by 07/30/2021. ?Kristine Elliott refuses c/s at this time.  I discussed the definition of arrest of dilation and risk associated with continuing to labor including fetal intolerance to labor and/or chorio.  She replies " I don't want one" and shares her understanding of risks.  I asked her when she will be ready for a c/s and she replies " I don't know.  I just don't want one".  We agreed to recheck her in 2 hours and re discuss.  The nurse and patient discussed waiting for Dr Lillia Abed to come on at 7:30am (9 hours) and I strongly recommended not waiting that long if not progressing.  At this time FHR is Cat 1 ? ? ?Kristine Elliott ?07/28/2021, 10:11 PM ? ? ?

## 2021-07-28 NOTE — MAU Note (Signed)
.  Kristine Elliott is a 26 y.o. at [redacted]w[redacted]d here in MAU reporting: water broke @ 0600, reports fluid is clear, but had pinkish tint.  Reports irregular ctxs ? ?Onset of complaint: 0600 ?Pain score: 0 ?Vitals:  ? 07/28/21 0720  ?BP: (!) 135/98  ?Pulse: (!) 112  ?Resp: 20  ?Temp: 97.8 ?F (36.6 ?C)  ?SpO2: 99%  ?   ?FHT: 127 bpm w/ +FM.  Denies VB ?Lab orders placed from triage:    ?

## 2021-07-28 NOTE — Plan of Care (Signed)
?  Problem: Education: ?Goal: Ability to make informed decisions regarding treatment and plan of care will improve ?Outcome: Progressing ?  ?Problem: Coping: ?Goal: Ability to verbalize concerns and feelings about labor and delivery will improve ?Outcome: Progressing ?  ?Problem: Pain Management: ?Goal: Relief or control of pain from uterine contractions will improve ?Outcome: Progressing ?  ?Problem: Education: ?Goal: Knowledge of General Education information will improve ?Description: Including pain rating scale, medication(s)/side effects and non-pharmacologic comfort measures ?Outcome: Progressing ?  ?

## 2021-07-28 NOTE — Anesthesia Procedure Notes (Signed)
Epidural ?Patient location during procedure: OB ?Start time: 07/28/2021 2:15 PM ?End time: 07/28/2021 2:28 PM ? ?Staffing ?Anesthesiologist: Lucretia Kern, MD ?Performed: anesthesiologist  ? ?Preanesthetic Checklist ?Completed: patient identified, IV checked, risks and benefits discussed, monitors and equipment checked, pre-op evaluation and timeout performed ? ?Epidural ?Patient position: sitting ?Prep: DuraPrep ?Patient monitoring: heart rate, continuous pulse ox and blood pressure ?Approach: midline ?Location: L3-L4 ?Injection technique: LOR air ? ?Needle:  ?Needle type: Tuohy  ?Needle gauge: 17 G ?Needle length: 9 cm ?Needle insertion depth: 8 cm ?Catheter type: closed end flexible ?Catheter size: 19 Gauge ?Catheter at skin depth: 13 cm ?Test dose: negative ? ?Assessment ?Events: blood not aspirated, injection not painful, no injection resistance, no paresthesia and negative IV test ? ?Additional Notes ?Reason for block:procedure for pain ? ? ? ?

## 2021-07-28 NOTE — Anesthesia Preprocedure Evaluation (Addendum)
Anesthesia Evaluation  ?Patient identified by MRN, date of birth, ID band ?Patient awake ? ? ? ?Reviewed: ?Allergy & Precautions, H&P , NPO status , Patient's Chart, lab work & pertinent test results, reviewed documented beta blocker date and time  ? ?History of Anesthesia Complications ?Negative for: history of anesthetic complications ? ?Airway ?Mallampati: II ? ?TM Distance: >3 FB ? ? ? ? Dental ?no notable dental hx. ? ?  ?Pulmonary ?neg pulmonary ROS,  ?  ?Pulmonary exam normal ? ? ? ? ? ? ? Cardiovascular ?negative cardio ROS ? ? ?Rhythm:regular Rate:Normal ? ? ?  ?Neuro/Psych ?Anxiety Depression negative neurological ROS ?   ? GI/Hepatic ?negative GI ROS, Neg liver ROS,   ?Endo/Other  ?Morbid obesity ? Renal/GU ?  ? ?  ?Musculoskeletal ? ?(+) Fibromyalgia - ? Abdominal ?(+) + obese,   ?Peds ? Hematology ?negative hematology ROS ?(+)   ?Anesthesia Other Findings ? ? Reproductive/Obstetrics ?(+) Pregnancy ? ?  ? ? ? ? ? ? ? ? ? ? ? ? ? ?  ?  ? ? ? ? ? ? ? ?Anesthesia Physical ?Anesthesia Plan ? ?ASA: 3 and emergent ? ?Anesthesia Plan: Epidural  ? ?Post-op Pain Management: Epidural*  ? ?Induction: Intravenous ? ?PONV Risk Score and Plan: 4 or greater and Treatment may vary due to age or medical condition and Scopolamine patch - Pre-op ? ?Airway Management Planned: Natural Airway ? ?Additional Equipment:  ? ?Intra-op Plan:  ? ?Post-operative Plan:  ? ?Informed Consent: I have reviewed the patients History and Physical, chart, labs and discussed the procedure including the risks, benefits and alternatives for the proposed anesthesia with the patient or authorized representative who has indicated his/her understanding and acceptance.  ? ? ? ?Dental advisory given ? ?Plan Discussed with: CRNA and Anesthesiologist ? ?Anesthesia Plan Comments:   ? ? ? ? ? ?Anesthesia Quick Evaluation ? ?

## 2021-07-29 ENCOUNTER — Encounter (HOSPITAL_COMMUNITY)
Admission: AD | Disposition: A | Discharge status: Home / Self Care | Payer: Self-pay | Source: Home / Self Care | Attending: Obstetrics and Gynecology

## 2021-07-29 ENCOUNTER — Inpatient Hospital Stay (HOSPITAL_COMMUNITY)
Admission: AD | Admit: 2021-07-29 | Payer: No Typology Code available for payment source | Source: Home / Self Care | Admitting: Obstetrics & Gynecology

## 2021-07-29 ENCOUNTER — Inpatient Hospital Stay (HOSPITAL_COMMUNITY): Payer: No Typology Code available for payment source

## 2021-07-29 ENCOUNTER — Encounter (HOSPITAL_COMMUNITY): Payer: Self-pay | Admitting: Obstetrics and Gynecology

## 2021-07-29 DIAGNOSIS — Z3A39 39 weeks gestation of pregnancy: Secondary | ICD-10-CM

## 2021-07-29 HISTORY — DX: Headache, unspecified: R51.9

## 2021-07-29 LAB — CBC
HCT: 32 % — ABNORMAL LOW (ref 36.0–46.0)
Hemoglobin: 10.3 g/dL — ABNORMAL LOW (ref 12.0–15.0)
MCH: 25.6 pg — ABNORMAL LOW (ref 26.0–34.0)
MCHC: 32.2 g/dL (ref 30.0–36.0)
MCV: 79.4 fL — ABNORMAL LOW (ref 80.0–100.0)
Platelets: 245 10*3/uL (ref 150–400)
RBC: 4.03 MIL/uL (ref 3.87–5.11)
RDW: 14.3 % (ref 11.5–15.5)
WBC: 35.3 10*3/uL — ABNORMAL HIGH (ref 4.0–10.5)
nRBC: 0 % (ref 0.0–0.2)

## 2021-07-29 SURGERY — Surgical Case
Anesthesia: Epidural

## 2021-07-29 MED ORDER — DIPHENHYDRAMINE HCL 25 MG PO CAPS: 25.0000 mg | ORAL_CAPSULE | ORAL | Status: DC | PRN

## 2021-07-29 MED ORDER — DIPHENHYDRAMINE HCL 25 MG PO CAPS: 25.0000 mg | ORAL_CAPSULE | Freq: Four times a day (QID) | ORAL | Status: DC | PRN

## 2021-07-29 MED ORDER — SENNOSIDES-DOCUSATE SODIUM 8.6-50 MG PO TABS
2.0000 | ORAL_TABLET | ORAL | Status: DC
  Administered 2021-07-30 – 2021-07-31 (×2): 2 via ORAL
  Filled 2021-07-29 (×2): qty 2

## 2021-07-29 MED ORDER — SODIUM CHLORIDE 0.9 % IV SOLN
INTRAVENOUS | Status: DC | PRN
  Administered 2021-07-29: 2 g via INTRAVENOUS

## 2021-07-29 MED ORDER — COCONUT OIL OIL: 1.0000 "application " | TOPICAL_OIL | Status: DC | PRN

## 2021-07-29 MED ORDER — SODIUM CHLORIDE 0.9% FLUSH: 3.0000 mL | INTRAVENOUS | Status: DC | PRN

## 2021-07-29 MED ORDER — SERTRALINE HCL 100 MG PO TABS
100.0000 mg | ORAL_TABLET | Freq: Every day | ORAL | Status: DC
  Administered 2021-07-29 – 2021-07-31 (×3): 100 mg via ORAL
  Filled 2021-07-29 (×3): qty 1

## 2021-07-29 MED ORDER — MEPERIDINE HCL 25 MG/ML IJ SOLN: 6.2500 mg | INTRAMUSCULAR | Status: DC | PRN

## 2021-07-29 MED ORDER — LACTATED RINGERS IV SOLN: INTRAVENOUS | Status: DC | PRN

## 2021-07-29 MED ORDER — DEXAMETHASONE SODIUM PHOSPHATE 10 MG/ML IJ SOLN
INTRAMUSCULAR | Status: DC | PRN
  Administered 2021-07-29: 10 mg via INTRAVENOUS

## 2021-07-29 MED ORDER — SERTRALINE HCL 50 MG PO TABS: 50.0000 mg | ORAL_TABLET | Freq: Every day | ORAL | Status: DC

## 2021-07-29 MED ORDER — OXYTOCIN-SODIUM CHLORIDE 30-0.9 UT/500ML-% IV SOLN
2.5000 [IU]/h | INTRAVENOUS | Status: AC
  Administered 2021-07-29: 2.5 [IU]/h via INTRAVENOUS

## 2021-07-29 MED ORDER — OXYTOCIN-SODIUM CHLORIDE 30-0.9 UT/500ML-% IV SOLN
INTRAVENOUS | Status: AC
  Filled 2021-07-29: qty 500

## 2021-07-29 MED ORDER — OXYTOCIN-SODIUM CHLORIDE 30-0.9 UT/500ML-% IV SOLN
INTRAVENOUS | Status: DC | PRN
  Administered 2021-07-29: 30 [IU] via INTRAVENOUS

## 2021-07-29 MED ORDER — PRENATAL MULTIVITAMIN CH
1.0000 | ORAL_TABLET | Freq: Every day | ORAL | Status: DC
  Administered 2021-07-30: 1 via ORAL
  Filled 2021-07-29: qty 1

## 2021-07-29 MED ORDER — DIBUCAINE (PERIANAL) 1 % EX OINT: 1.0000 "application " | TOPICAL_OINTMENT | CUTANEOUS | Status: DC | PRN

## 2021-07-29 MED ORDER — ZOLPIDEM TARTRATE 5 MG PO TABS: 5.0000 mg | ORAL_TABLET | Freq: Every evening | ORAL | Status: DC | PRN

## 2021-07-29 MED ORDER — NALOXONE HCL 0.4 MG/ML IJ SOLN: 0.4000 mg | INTRAMUSCULAR | Status: DC | PRN

## 2021-07-29 MED ORDER — LIDOCAINE-EPINEPHRINE (PF) 2 %-1:200000 IJ SOLN
INTRAMUSCULAR | Status: DC | PRN
  Administered 2021-07-29: 2 mL via EPIDURAL
  Administered 2021-07-29 (×2): 5 mL via EPIDURAL

## 2021-07-29 MED ORDER — MORPHINE SULFATE (PF) 0.5 MG/ML IJ SOLN
INTRAMUSCULAR | Status: DC | PRN
  Administered 2021-07-29: 3 mg via EPIDURAL

## 2021-07-29 MED ORDER — OXYCODONE-ACETAMINOPHEN 5-325 MG PO TABS: 1.0000 | ORAL_TABLET | ORAL | Status: DC | PRN

## 2021-07-29 MED ORDER — PROMETHAZINE HCL 25 MG PO TABS: 25.0000 mg | ORAL_TABLET | Freq: Four times a day (QID) | ORAL | Status: DC | PRN

## 2021-07-29 MED ORDER — NALOXONE HCL 4 MG/10ML IJ SOLN
1.0000 ug/kg/h | INTRAVENOUS | Status: DC | PRN
  Filled 2021-07-29: qty 5

## 2021-07-29 MED ORDER — MEASLES, MUMPS & RUBELLA VAC IJ SOLR: 0.5000 mL | Freq: Once | INTRAMUSCULAR | Status: DC

## 2021-07-29 MED ORDER — SIMETHICONE 80 MG PO CHEW: 80.0000 mg | CHEWABLE_TABLET | ORAL | Status: DC | PRN

## 2021-07-29 MED ORDER — WITCH HAZEL-GLYCERIN EX PADS: 1.0000 "application " | MEDICATED_PAD | CUTANEOUS | Status: DC | PRN

## 2021-07-29 MED ORDER — SODIUM CHLORIDE 0.9 % IV SOLN: INTRAVENOUS | Status: DC | PRN

## 2021-07-29 MED ORDER — IBUPROFEN 600 MG PO TABS
600.0000 mg | ORAL_TABLET | Freq: Four times a day (QID) | ORAL | Status: DC | PRN
  Administered 2021-07-30 – 2021-07-31 (×4): 600 mg via ORAL
  Filled 2021-07-29 (×4): qty 1

## 2021-07-29 MED ORDER — KETOROLAC TROMETHAMINE 30 MG/ML IJ SOLN
30.0000 mg | Freq: Four times a day (QID) | INTRAMUSCULAR | Status: AC | PRN
  Administered 2021-07-29 – 2021-07-30 (×4): 30 mg via INTRAVENOUS
  Filled 2021-07-29 (×3): qty 1

## 2021-07-29 MED ORDER — ONDANSETRON HCL 4 MG/2ML IJ SOLN: 4.0000 mg | Freq: Three times a day (TID) | INTRAMUSCULAR | Status: DC | PRN

## 2021-07-29 MED ORDER — SIMETHICONE 80 MG PO CHEW
80.0000 mg | CHEWABLE_TABLET | Freq: Three times a day (TID) | ORAL | Status: DC
  Administered 2021-07-29 – 2021-07-31 (×7): 80 mg via ORAL
  Filled 2021-07-29 (×7): qty 1

## 2021-07-29 MED ORDER — KETOROLAC TROMETHAMINE 30 MG/ML IJ SOLN: 30.0000 mg | Freq: Four times a day (QID) | INTRAMUSCULAR | Status: AC | PRN

## 2021-07-29 MED ORDER — PRENATAL 27-0.8 MG PO TABS: 1.0000 | ORAL_TABLET | Freq: Every day | ORAL | Status: DC

## 2021-07-29 MED ORDER — ACETAMINOPHEN 325 MG PO TABS
650.0000 mg | ORAL_TABLET | ORAL | Status: DC | PRN
  Filled 2021-07-29: qty 2

## 2021-07-29 MED ORDER — FENTANYL CITRATE (PF) 100 MCG/2ML IJ SOLN: 25.0000 ug | INTRAMUSCULAR | Status: DC | PRN

## 2021-07-29 MED ORDER — TETANUS-DIPHTH-ACELL PERTUSSIS 5-2.5-18.5 LF-MCG/0.5 IM SUSY: 0.5000 mL | PREFILLED_SYRINGE | Freq: Once | INTRAMUSCULAR | Status: DC

## 2021-07-29 MED ORDER — PHENYLEPHRINE HCL (PRESSORS) 10 MG/ML IV SOLN
INTRAVENOUS | Status: DC | PRN
  Administered 2021-07-29: 40 ug via INTRAVENOUS

## 2021-07-29 MED ORDER — KETOROLAC TROMETHAMINE 30 MG/ML IJ SOLN
INTRAMUSCULAR | Status: AC
  Filled 2021-07-29: qty 1

## 2021-07-29 MED ORDER — ONDANSETRON HCL 4 MG/2ML IJ SOLN
INTRAMUSCULAR | Status: DC | PRN
  Administered 2021-07-29: 4 mg via INTRAVENOUS

## 2021-07-29 MED ORDER — MENTHOL 3 MG MT LOZG: 1.0000 | LOZENGE | OROMUCOSAL | Status: DC | PRN

## 2021-07-29 MED ORDER — LACTATED RINGERS IV SOLN: INTRAVENOUS | Status: DC

## 2021-07-29 MED ORDER — DIPHENHYDRAMINE HCL 50 MG/ML IJ SOLN
12.5000 mg | INTRAMUSCULAR | Status: DC | PRN
Start: 2021-07-29 — End: 2021-07-31
  Administered 2021-07-29: 12.5 mg via INTRAVENOUS
  Filled 2021-07-29: qty 1

## 2021-07-29 MED ORDER — SODIUM CHLORIDE 0.9 % IR SOLN
Status: DC | PRN
  Administered 2021-07-29: 1

## 2021-07-29 MED ORDER — STERILE WATER FOR IRRIGATION IR SOLN
Status: DC | PRN
  Administered 2021-07-29: 1000 mL

## 2021-07-29 MED ORDER — MORPHINE SULFATE (PF) 0.5 MG/ML IJ SOLN
INTRAMUSCULAR | Status: AC
  Filled 2021-07-29: qty 10

## 2021-07-29 MED ORDER — SCOPOLAMINE 1 MG/3DAYS TD PT72
MEDICATED_PATCH | TRANSDERMAL | Status: DC | PRN
  Administered 2021-07-29: 1 via TRANSDERMAL

## 2021-07-29 SURGICAL SUPPLY — 35 items
BENZOIN TINCTURE PRP APPL 2/3 (GAUZE/BANDAGES/DRESSINGS) ×1 IMPLANT
CELL SAVER LIPIGURD (MISCELLANEOUS) ×1 IMPLANT
CHLORAPREP W/TINT 26ML (MISCELLANEOUS) ×3 IMPLANT
CLAMP CORD UMBIL (MISCELLANEOUS) IMPLANT
CLOTH BEACON ORANGE TIMEOUT ST (SAFETY) ×2 IMPLANT
DEVICE RETRIEVAL ALEXIS 14 (MISCELLANEOUS) IMPLANT
DRSG OPSITE POSTOP 4X10 (GAUZE/BANDAGES/DRESSINGS) ×2 IMPLANT
ELECT REM PT RETURN 9FT ADLT (ELECTROSURGICAL) ×2
ELECTRODE REM PT RTRN 9FT ADLT (ELECTROSURGICAL) ×1 IMPLANT
EXTRACTOR VACUUM M CUP 4 TUBE (SUCTIONS) IMPLANT
EXTRT SYSTEM ALEXIS 14CM (MISCELLANEOUS) ×2
GLOVE BIOGEL PI IND STRL 7.0 (GLOVE) ×1 IMPLANT
GLOVE BIOGEL PI INDICATOR 7.0 (GLOVE) ×1
GLOVE SURG ORTHO 8.0 STRL STRW (GLOVE) ×2 IMPLANT
GOWN STRL REUS W/TWL LRG LVL3 (GOWN DISPOSABLE) ×4 IMPLANT
HEMOSTAT ARISTA ABSORB 3G PWDR (HEMOSTASIS) ×1 IMPLANT
KIT ABG SYR 3ML LUER SLIP (SYRINGE) ×2 IMPLANT
NDL HYPO 25X5/8 SAFETYGLIDE (NEEDLE) ×1 IMPLANT
NEEDLE HYPO 25X5/8 SAFETYGLIDE (NEEDLE) ×2 IMPLANT
NS IRRIG 1000ML POUR BTL (IV SOLUTION) ×2 IMPLANT
PACK C SECTION WH (CUSTOM PROCEDURE TRAY) ×2 IMPLANT
PAD OB MATERNITY 4.3X12.25 (PERSONAL CARE ITEMS) ×2 IMPLANT
PENCIL SMOKE EVAC W/HOLSTER (ELECTROSURGICAL) ×2 IMPLANT
STRIP CLOSURE SKIN 1/2X4 (GAUZE/BANDAGES/DRESSINGS) ×1 IMPLANT
SUT MNCRL 0 VIOLET CTX 36 (SUTURE) ×3 IMPLANT
SUT MNCRL AB 3-0 PS2 27 (SUTURE) ×1 IMPLANT
SUT MON AB 4-0 PS1 27 (SUTURE) ×2 IMPLANT
SUT MONOCRYL 0 CTX 36 (SUTURE) ×4
SUT PDS AB 1 CT  36 (SUTURE)
SUT PDS AB 1 CT 36 (SUTURE) IMPLANT
SUT VIC AB 1 CTX 36 (SUTURE)
SUT VIC AB 1 CTX36XBRD ANBCTRL (SUTURE) IMPLANT
TOWEL OR 17X24 6PK STRL BLUE (TOWEL DISPOSABLE) ×2 IMPLANT
TRAY FOLEY W/BAG SLVR 14FR LF (SET/KITS/TRAYS/PACK) ×2 IMPLANT
WATER STERILE IRR 1000ML POUR (IV SOLUTION) ×2 IMPLANT

## 2021-07-29 NOTE — Transfer of Care (Signed)
Immediate Anesthesia Transfer of Care Note ? ?Patient: Kristine Elliott ? ?Procedure(s) Performed: CESAREAN SECTION ? ?Patient Location: PACU ? ?Anesthesia Type:Epidural ? ?Level of Consciousness: awake, alert  and oriented ? ?Airway & Oxygen Therapy: Patient Spontanous Breathing ? ?Post-op Assessment: Report given to RN and Post -op Vital signs reviewed and stable ? ?Post vital signs: Reviewed and stable ? ?Last Vitals:  ?Vitals Value Taken Time  ?BP 110/61 07/29/21 0151  ?Temp    ?Pulse 85 07/29/21 0157  ?Resp 18 07/29/21 0157  ?SpO2 100 % 07/29/21 0157  ?Vitals shown include unvalidated device data. ? ?Last Pain:  ?Vitals:  ? 07/28/21 2000  ?TempSrc: Oral  ?PainSc:   ?   ? ?  ? ?Complications: No notable events documented. ?

## 2021-07-29 NOTE — Lactation Note (Signed)
This note was copied from a baby's chart. ?Lactation Consultation Note ? ?Patient Name: Kristine Elliott ?Today's Date: 07/29/2021 ?Reason for consult: Follow-up assessment;Mother's request;Difficult latch;Primapara;1st time breastfeeding;Term;Breastfeeding assistance ?Age:26 hours ? ?LC assisted with suck training getting infant to relax tongue down. Infant fed 4 ml on spoon and then observed a 10 min latch. ?Mom given manual pump to pre pump 5-10 min before latching.  ?With breast compression, Mom milk can shoot out, better latch cross cradle prone with her reclined to allow infant better control of the flow.  ?Mom pumped milk frozen from home. LC went over milk storage if milk thaws it has to be used in 24 hrs. Milk brought to room temp, must be used in 2hrs.  ? ?Plan 1. To feed based on cues 8-12x 24hr period. Mom to offer breasts and look for signs of milk transfer.  ?2. Mom to offer EBM via spoon 5-7 ml if unable to latch. ?3. I and O sheet reviewed ?All questions answered at the end of the visit.  ? ?Maternal Data ?Has patient been taught Hand Expression?: Yes ?Does the patient have breastfeeding experience prior to this delivery?: No ? ?Feeding ?Mother's Current Feeding Choice: Breast Milk ? ?LATCH Score ?Latch: Repeated attempts needed to sustain latch, nipple held in mouth throughout feeding, stimulation needed to elicit sucking reflex. ? ?Audible Swallowing: A few with stimulation ? ?Type of Nipple: Everted at rest and after stimulation (short shafted) ? ?Comfort (Breast/Nipple): Soft / non-tender ? ?Hold (Positioning): Assistance needed to correctly position infant at breast and maintain latch. ? ?LATCH Score: 7 ? ? ?Lactation Tools Discussed/Used ?Tools: Pump;Flanges ?Flange Size: 21 ?Breast pump type: Manual ?Pump Education: Setup, frequency, and cleaning;Milk Storage ?Reason for Pumping: elongate nipples ?Pumping frequency: pre pump 5-10 min before latching ? ?Interventions ?Interventions: Breast  feeding basics reviewed;Assisted with latch;Skin to skin;Breast massage;Hand express;Breast compression;Adjust position;Support pillows;Position options;Expressed milk;Hand pump;Education;LC Psychologist, educational;Infant Driven Feeding Algorithm education ? ?Discharge ?Pump: Personal ?WIC Program: No ? ?Consult Status ?Consult Status: Follow-up ?Date: 07/30/21 ?Follow-up type: In-patient ? ? ? ?Kristine Zeiner  Elliott ?07/29/2021, 3:19 PM ? ? ? ?

## 2021-07-29 NOTE — Lactation Note (Deleted)
This note was copied from a baby's chart. Lactation Consultation Note   

## 2021-07-29 NOTE — Progress Notes (Signed)
Patient ID: Kristine Elliott, female   DOB: [birth year], 26 y.o.   MRN: [id number] ?Cx Check with no change from 4cm ?FHR 120-130s Cat 1 ?Ctxs adequate by [facility] ? ?Arrest of dilation - Illeana now wants to proceed with primary c/s ?Risks and benefits of C/S were discussed.  All questions were answered and informed consent was obtained.  Plan to proceed with low segment transverse Cesarean Section.  ?

## 2021-07-29 NOTE — Lactation Note (Signed)
This note was copied from a baby's chart. ?Lactation Consultation Note ? ?Patient Name: Kristine Elliott ?Today's Date: 07/29/2021 ?Reason for consult: Initial assessment;Primapara;1st time breastfeeding;Term ?Age:26 hours ? ?LC in to visit with P1 Mom of term baby delivered by C/S for FTP.  Baby dressed and swaddled in Mom's arms.  Baby has had 2 feedings and 1 attempt where baby fell asleep following latch.  Latch score of 8. ? ?Encouraged Mom to undress baby for STS on Mom's chest.  Baby sleeping and not showing any feeding cues currently.  Baby has had 2 stools and voids already.  Mom reports baby latches well without any pain.   ? ?Prenatally, Mom hand expressed colostrum and currently has a gallon size bag of 1 ml syringes.  Mom understands the importance of baby latching to the breast, but wanted the colostrum at the hospital prn. ? ?Talked about hand expression and spoon feeding if baby is too sleepy to wake and latch.  Reviewed normal newborn sleepiness and importance of STS.   ? ?Mom aware of IP and OP lactation support available to her and encouraged her to call prn for assistance. ? ? ?Interventions ?Interventions: Breast feeding basics reviewed;Skin to skin;Breast massage;Hand express;LC Services brochure ? ?Consult Status ?Consult Status: Follow-up ?Date: 07/30/21 ?Follow-up type: In-patient ? ? ? ?Johny Blamer E ?07/29/2021, 9:55 AM ? ? ? ?

## 2021-07-29 NOTE — Op Note (Signed)
Cesarean Section Procedure Note ? ?Pre-operative Diagnosis: IUP at 39 weeks, Arrest of dilation, macrosomia ? ?Post-operative Diagnosis: same ? ?Surgeon: Turner Daniels  ? ?Assistants: none ? ?Anesthesia: epidural ? ?Procedure:  Low Segment Transverse cesarean section ? ?Procedure Details  ?The patient was seen in the Holding Room. The risks, benefits, complications, treatment options, and expected outcomes were discussed with the patient.  The patient concurred with the proposed plan, giving informed consent.  The site of surgery properly noted/marked.. A Time Out was held and the above information confirmed. ? ?After induction of anesthesia, the patient was draped and prepped in the usual sterile manner. A Pfannenstiel incision was made and carried down through the subcutaneous tissue to the fascia. Fascial incision was made and extended transversely. The fascia was separated from the underlying rectus tissue superiorly and inferiorly. The peritoneum was identified and entered. Peritoneal incision was extended longitudinally. The utero-vesical peritoneal reflection was incised transversely and the bladder flap was bluntly freed from the lower uterine segment. A low transverse uterine incision was made. Delivered from vertex presentation was a baby with Apgar scores of 9 at one minute and 9 at five minutes. After the umbilical cord was clamped and cut cord blood was obtained for evaluation. The placenta was removed intact and appeared normal. The uterine outline, tubes and ovaries appeared normal. The uterine incision was closed with running locked sutures of 0 monocryl and imbricated with 0 monocryl. Hemostasis was observed. Lavage was carried out until clear. The peritoneum was then closed with 0 monocryl and rectus muscles plicated in the midline.  After hemostasis was assured, the fascia was then reapproximated with running sutures of 0 PDS. Irrigation was applied and after adequate hemostasis was assured, the  skin was reapproximated with subcutaneous sutures using 4-0 monocryl. ? ?Instrument, sponge, and needle counts were correct prior the abdominal closure and at the conclusion of the case. The patient received 2 grams cefotetan preoperatively. ? ?Findings: ?Viable  female ? ?Estimated Blood Loss:  1177cc ?        ?Specimens: Placenta was sent to labor and delivery ?        ?Complications:  None  ?

## 2021-07-29 NOTE — Anesthesia Postprocedure Evaluation (Signed)
Anesthesia Post Note ? ?Patient: Kristine Elliott ? ?Procedure(s) Performed: CESAREAN SECTION ? ?  ? ?Patient location during evaluation: PACU ?Anesthesia Type: Epidural ?Level of consciousness: oriented and awake and alert ?Pain management: pain level controlled ?Vital Signs Assessment: post-procedure vital signs reviewed and stable ?Respiratory status: spontaneous breathing, respiratory function stable and nonlabored ventilation ?Cardiovascular status: blood pressure returned to baseline and stable ?Postop Assessment: no headache, no backache, no apparent nausea or vomiting, epidural receding and patient able to bend at knees ?Anesthetic complications: no ? ? ?No notable events documented. ? ?Last Vitals:  ?Vitals:  ? 07/29/21 0215 07/29/21 0230  ?BP: 105/69 (!) 106/54  ?Pulse: 73 72  ?Resp: 19 17  ?Temp: 37.1 ?C   ?SpO2: 100% 100%  ?  ?Last Pain:  ?Vitals:  ? 07/29/21 0230  ?TempSrc:   ?PainSc: 2   ? ?Pain Goal:   ? ?LLE Motor Response: Purposeful movement (07/29/21 0230) ?LLE Sensation: Full sensation (07/29/21 0230) ?RLE Motor Response: Purposeful movement (07/29/21 0230) ?RLE Sensation: Full sensation (07/29/21 0230) ?  ?  ?Epidural/Spinal Function Cutaneous sensation: Tingles (07/29/21 0230), Patient able to flex knees: Yes (07/29/21 0230), Patient able to lift hips off bed: No (07/29/21 0230), Back pain beyond tenderness at insertion site: No (07/29/21 0230), Progressively worsening motor and/or sensory loss: No (07/29/21 0230), Bowel and/or bladder incontinence post epidural: No (07/29/21 0230) ? ?Lindsey Hommel A. ? ? ? ? ?

## 2021-07-29 NOTE — Progress Notes (Signed)
Subjective: ?Postpartum Day 0: Cesarean Delivery ?Patient reports tolerating PO.  Foley still in.  Pain well controlled. ? ?Objective: ?Vital signs in last 24 hours: ?Temp:  [97.5 ?F (36.4 ?C)-98.8 ?F (37.1 ?C)] 98 ?F (36.7 ?C) (03/22 0630) ?Pulse Rate:  [68-105] 80 (03/22 0630) ?Resp:  [17-24] 18 (03/22 0745) ?BP: (100-143)/(48-97) 127/77 (03/22 0630) ?SpO2:  [98 %-100 %] 100 % (03/22 0630) ? ?Physical Exam:  ?General: alert, cooperative, and appears stated age ?Lochia: appropriate ?Uterine Fundus: firm ?Incision: healing well, no significant drainage ?DVT Evaluation: No evidence of DVT seen on physical exam. ?Negative Homan's sign. ?No cords or calf tenderness. ? ?Recent Labs  ?  07/28/21 ?0751 07/29/21 ?0500  ?HGB 11.3* 10.3*  ?HCT 35.3* 32.0*  ? ? ?Assessment/Plan: ?Status post Cesarean section. Doing well postoperatively.  ?Continue current care. ?Leukocytosis-WBC 35.3 this AM.  Afebrile.  Will recheck CBC tomorrow. ?Planning circ for tomorrow. ? ?Kristine Elliott ?07/29/2021, 8:09 AM ? ? ?

## 2021-07-30 LAB — CBC
HCT: 25.9 % — ABNORMAL LOW (ref 36.0–46.0)
Hemoglobin: 8.2 g/dL — ABNORMAL LOW (ref 12.0–15.0)
MCH: 25.5 pg — ABNORMAL LOW (ref 26.0–34.0)
MCHC: 31.7 g/dL (ref 30.0–36.0)
MCV: 80.7 fL (ref 80.0–100.0)
Platelets: 213 10*3/uL (ref 150–400)
RBC: 3.21 MIL/uL — ABNORMAL LOW (ref 3.87–5.11)
RDW: 14.5 % (ref 11.5–15.5)
WBC: 22.3 10*3/uL — ABNORMAL HIGH (ref 4.0–10.5)
nRBC: 0 % (ref 0.0–0.2)

## 2021-07-30 NOTE — Lactation Note (Signed)
This note was copied from a baby's chart. ?Lactation Consultation Note ? ?Patient Name: Kristine Elliott ?Today's Date: 07/30/2021 ?Reason for consult: Follow-up assessment;Mother's request;Primapara;1st time breastfeeding;Term;Infant weight loss ?Age:26 hours ? ?Visited with mom of 84 hours old FT female, she's a P1 and is having a hard time getting baby to latch at the breast, but baby has no issues taking a bottle. LC assisted with latch to the right breast on cross cradle hold, baby latched briefly but then he would stop sucking (see LATCH score). Kristine Elliott voiced that she can't get the same volumes with the Medela hand pump Vs. her hand pump at home (Lansinoh). Set up a DEBP per mom's request, she fed baby a bottle with EBM after this attempt. Noticed baby drank 18 ml of breastmilk in 2 minutes, suggested to try one of our extra slow bottle nipples (she brought her own bottles and nipples from home). MBU RN Tamela Oddi will provide mom with extra slow nipples to try on the next feeding. Reviewed pumping schedule, lactogenesis II, size of baby's stomach, supplementation guidelines, cluster feeding and expectations. ? ?Maternal Data ? Mom supply is ANL, she's been expressing milk prenatally ? ?Feeding ?Mother's Current Feeding Choice: Breast Milk ?Nipple Type:  (MAM- mother provides bottle and nipple) ? ?LATCH Score ?Latch: Repeated attempts needed to sustain latch, nipple held in mouth throughout feeding, stimulation needed to elicit sucking reflex. (fussy baby, he won't sustain the latch even with breast compressions, baby's suck is still uncoodinated, the predominant patter during this feeding/attempt was bitting and clamping instead of sucking with a tug. Try a NS # 20 with the same results) ? ?Audible Swallowing: None (colostrum noted on NS # 20 though) ? ?Type of Nipple: Everted at rest and after stimulation ? ?Comfort (Breast/Nipple): Soft / non-tender ? ?Hold (Positioning): Assistance needed to correctly position  infant at breast and maintain latch. ? ?LATCH Score: 6 ? ?Lactation Tools Discussed/Used ?Tools: Pump;Flanges ?Flange Size: 24;21 ?Breast pump type: Double-Electric Breast Pump;Manual ?Pump Education: Setup, frequency, and cleaning;Milk Storage ?Reason for Pumping: mother's choice ?Pumping frequency: after feedings/attempt at the breast or every 3 hours ?Pumped volume: 30 mL ? ?Interventions ?Interventions: Breast feeding basics reviewed;Assisted with latch;Breast massage;Hand express;Support pillows;Adjust position;Breast compression;Expressed milk;DEBP;Hand pump ? ?Plan of care ?Encouraged mom to continue putting baby to breast 8-12 times/24 hours or sooner if feeding cues are present ?She'll pump after feedings/attempts at the breast, or every 3 hours ?Parents will continue supplementing baby with mother's milk per parent's choice ? ?FOB present and very supportive. All questions and concerns answered, parents to contact LC services PRN. ? ?Discharge ?Pump: DEBP;Personal (Lansinoh hand pump at home) ? ?Consult Status ?Consult Status: Follow-up ?Date: 07/31/21 ?Follow-up type: In-patient ? ? ?Danira Nylander S Song Myre ?07/30/2021, 4:40 PM ? ? ? ?

## 2021-07-30 NOTE — Social Work (Signed)
CSW received consult for hx of Anxiety, Depression and Edinburgh 12.  CSW met with Kristine Elliott to offer support and complete assessment.   ? ?CSW met with Kristine Elliott at bedside and introduced CSW role. CSW observed Kristine Elliott holding the infant and FOB up in the room providing support to Kristine Elliott. Kristine Elliott presented calm and engaged well with CSW during the visit. CSW inquired how Kristine Elliott has felt since giving birth. Kristine Elliott reported that she been feeling good. Kristine Elliott shared she had a c-section and recovering well with some pain.  Kristine Elliott acknowledged that she has history of anxiety and depression. Kristine Elliott reported that she was diagnosed in 2020 but felt like she has always had it. CSW inquired how Kristine Elliott felt emotionally during the pregnancy and discussed the Edinburgh.  Kristine Elliott reported she felt fine until about 32 weeks when she noticed an increase in her anxiety. Kristine Elliott reported feeling sad, miserable, panicky, and anxious. Kristine Elliott reported her provider increased her Zoloft to 100mg and she will continue the regimen postpartum. Kristine Elliott reported no interest in therapy. CSW inquired about Kristine Elliott coping strategies. Kristine Elliott reported that taking naps help her rejuvenate, and cuddling her cats is enjoyable. CSW encouraged Kristine Elliott to continue implementing them. Kristine Elliott identified her spouse, parents, in-laws, and her pets as supports.  ? ?CSW provided education regarding the baby blues period vs. perinatal mood disorders, discussed treatment and gave resources for mental health follow up if concerns arise.  CSW recommends self-evaluation during the postpartum time period using the New Mom Checklist from Postpartum Progress and encouraged Kristine Elliott to contact a medical professional if symptoms are noted at any time. Kristine Elliott reported she feels comfortable reaching out to her provider if she has concerns. CSW assessed Kristine Elliott for safety. Kristine Elliott denied thoughts of harm to self and others.  ? ?Kristine Elliott reported she has essential items for the infant including a bassinet where the infant will sleep. CSW provided review of Sudden  Infant Death Syndrome (SIDS) precautions. CSW has chosen [facility] for the infant's follow up care.  ? ?CSW identifies no further need for intervention and no barriers to discharge at this time.  ? ? ?Bronislaw Switzer, MSW, LCSW ?[facility]  ?Clinical Social Worker  ?[phone] ?07/30/2021  11:43 AM  ?

## 2021-07-30 NOTE — Progress Notes (Signed)
POD # 1  ?Doing well. ?BP 118/62 (BP Location: Right Arm)   Pulse 75   Temp 98.6 ?F (37 ?C) (Oral)   Resp 18   Ht 5\' 2"  (1.575 m)   Wt 116.2 kg   LMP 10/28/2020   SpO2 99%   Breastfeeding Unknown   BMI 46.86 kg/m?  ?Abdomen soft and non tender  ?Bandage clean and dry and intact ? ?Results for orders placed or performed during the hospital encounter of 07/28/21 (from the past 24 hour(s))  ?CBC     Status: Abnormal  ? Collection Time: 07/30/21  4:43 AM  ?Result Value Ref Range  ? WBC 22.3 (H) 4.0 - 10.5 K/uL  ? RBC 3.21 (L) 3.87 - 5.11 MIL/uL  ? Hemoglobin 8.2 (L) 12.0 - 15.0 g/dL  ? HCT 25.9 (L) 36.0 - 46.0 %  ? MCV 80.7 80.0 - 100.0 fL  ? MCH 25.5 (L) 26.0 - 34.0 pg  ? MCHC 31.7 30.0 - 36.0 g/dL  ? RDW 14.5 11.5 - 15.5 %  ? Platelets 213 150 - 400 K/uL  ? nRBC 0.0 0.0 - 0.2 %  ? ?POD # 1  ?Doing well  ?Circ done ?Discharge home tomorrow ?

## 2021-07-31 MED ORDER — IBUPROFEN 600 MG PO TABS: 600.0000 mg | ORAL_TABLET | Freq: Four times a day (QID) | ORAL | 0 refills | Status: AC | PRN

## 2021-07-31 NOTE — Lactation Note (Signed)
This note was copied from a baby's chart. ?Lactation Consultation Note ? ?Patient Name: Kristine Elliott ?Today's Date: 07/31/2021 ?Reason for consult: Follow-up assessment;Difficult latch ?Age:26 hours ? ?Mom states baby just does not want the breast.  She seemed discouraged.  ? ?BAby recently had 50ml EBM in a bottle.  BAby 41 hours old.  LC reviewed paced feeding and amounts.   ? ? ? LC encouraged her to continue to do sts and hand express offering the breast throughout the day.  Also recommended seeking support.  She is aware of cone B[facility] and LC recommended community resource baby [facility] in [place] where mom lives.   ? ?Mom denies questions.  She has several bags of frozen milk at home from collecting during pregnancy. ? ?Discussed pumping to encouraged milk supply while working on infant latch.   ?Reviewed prevention of engorgement and engorgement treatment. ? ?Maternal Data ?  ? ?Feeding ?Mother's Current Feeding Choice: Breast Milk ?Nipple Type: Nfant Standard Flow (white) ? ?LATCH Score ?  ? ?  ? ?  ? ?  ? ?  ? ?  ? ? ?Lactation Tools Discussed/Used ?  ? ?Interventions ?  ? ?Discharge ?Discharge Education: Engorgement and breast care;Warning signs for feeding baby ?Pump: Personal;DEBP ? ?Consult Status ?Consult Status: Complete ?Follow-up type: In-patient ? ? ? ?Ferne Coe [name] ?07/31/2021, 11:21 AM ? ? ? ?

## 2021-07-31 NOTE — Discharge Summary (Signed)
? ?  Postpartum Discharge Summary ? ? ? ?   ?Patient Name: Kristine Elliott ?DOB: 03-24-1996 ?MRN: [id number] ? ?Date of admission: 07/28/2021 ?Delivery date:07/29/2021  ?Delivering provider: Wilford Merryfield  ?Date of discharge: 07/31/2021 ? ?Admitting diagnosis: Normal labor [O80, Z37.9] ?Intrauterine pregnancy: [redacted]w[redacted]d     ?Secondary diagnosis:  Principal Problem: ?  Normal labor ? ?Additional problems: General anxiety disorder on zoloft    ?Discharge diagnosis: Term Pregnancy Delivered                                              ?Post partum procedures:   ?Augmentation: AROM, Pitocin, and Cytotec ?Complications: None ? ?Hospital course: Onset of Labor With Unplanned C/S   ?26 y.o. yo G1P1001 at [redacted]w[redacted]d was admitted in Latent Labor on 07/28/2021. Patient had a labor course significant for arrest of dilation. The patient went for cesarean section due to Arrest of Dilation. Delivery details as follows: ?Membrane Rupture Time/Date: 6:00 AM ,07/28/2021   ?Delivery Method:C-Section, Low Transverse  ?Details of operation can be found in separate operative note. Patient had an uncomplicated postpartum course.  She is ambulating,tolerating a regular diet, passing flatus, and urinating well.  Patient is discharged home in stable condition 07/31/21. ? ?Newborn Data: ?Birth date:07/29/2021  ?Birth time:12:56 AM  ?Gender:Female  ?Living status:Living  ?Apgars:8 ,9  ?Weight:4010 g  ? ?Magnesium Sulfate received: No ?BMZ received: No ?Rhophylac:N/A ?MMR:N/A ?T-DaP:Given prenatally ?Flu: N/A ?Transfusion:No ? ?Physical exam  ?Vitals:  ? 07/30/21 0530 07/30/21 1300 07/30/21 2130 07/31/21 0544  ?BP: 118/62 131/66 114/84 133/82  ?Pulse: 75 72 79 73  ?Resp: $Remov'18 18 17 17  'enqvtA$ ?Temp: 98.6 ?F (37 ?C) 98.1 ?F (36.7 ?C) 97.9 ?F (36.6 ?C) 97.7 ?F (36.5 ?C)  ?TempSrc: Oral Oral Oral Oral  ?SpO2: 99% 99% 99% 99%  ?Weight:      ?Height:      ? ?General: alert, cooperative, and no distress ?Lochia: appropriate ?Uterine Fundus: firm ?Incision: Healing well with no  significant drainage ?DVT Evaluation: No evidence of DVT seen on physical exam. ?Labs: ?Lab Results  ?Component Value Date  ? WBC 22.3 (H) 07/30/2021  ? HGB 8.2 (L) 07/30/2021  ? HCT 25.9 (L) 07/30/2021  ? MCV 80.7 07/30/2021  ? PLT 213 07/30/2021  ? ? ?  Latest Ref Rng & Units 02/17/2021  ?  8:30 PM  ?CMP  ?Glucose 70 - 99 mg/dL 90    ?BUN 6 - 20 mg/dL 9    ?Creatinine 0.44 - 1.00 mg/dL 0.45    ?Sodium 135 - 145 mmol/L 133    ?Potassium 3.5 - 5.1 mmol/L 3.7    ?Chloride 98 - 111 mmol/L 99    ?CO2 22 - 32 mmol/L 22    ?Calcium 8.9 - 10.3 mg/dL 9.3    ?Total Protein 6.5 - 8.1 g/dL 6.7    ?Total Bilirubin 0.3 - 1.2 mg/dL 0.6    ?Alkaline Phos 38 - 126 U/L 62    ?AST 15 - 41 U/L 19    ?ALT 0 - 44 U/L 17    ? ?Edinburgh Score: ? ?  07/29/2021  ?  7:45 AM  ?Edinburgh Postnatal Depression Scale Screening Tool  ?I have been able to laugh and see the funny side of things. 0  ?I have looked forward with enjoyment to things. 0  ?I have blamed myself unnecessarily when things  went wrong. 3  ?I have been anxious or worried for no good reason. 3  ?I have felt scared or panicky for no good reason. 2  ?Things have been getting on top of me. 1  ?I have been so unhappy that I have had difficulty sleeping. 1  ?I have felt sad or miserable. 2  ?I have been so unhappy that I have been crying. 0  ?The thought of harming myself has occurred to me. 0  ?Edinburgh Postnatal Depression Scale Total 12  ? ? ? ? ?After visit meds:  ? ? ? ?Discharge home in stable condition ?Infant Feeding: Breast ?Infant Disposition:home with mother ?Discharge instruction: per After Visit Summary and Postpartum booklet. ?Activity: Advance as tolerated. Pelvic rest for 6 weeks.  ?Diet: routine diet ?Anticipated Birth Control: Unsure ?Postpartum Appointment:6 weeks ?Additional Postpartum F/U:    ?Future Appointments:No future appointments. ?Follow up Visit: ? ? ?  ? ?07/31/2021 ?Luz Lex, MD ? ? ?

## 2021-08-04 ENCOUNTER — Encounter (HOSPITAL_COMMUNITY): Payer: No Typology Code available for payment source

## 2021-08-08 ENCOUNTER — Telehealth (HOSPITAL_COMMUNITY): Payer: Self-pay | Admitting: *Deleted

## 2021-08-08 NOTE — Telephone Encounter (Signed)
Patient voiced no questions or concerns regarding her health at this time. EPDS=3. Patient voiced no questions or concerns regarding infant at this time. Patient reports infant sleeps in a bassinet on his back. RN reviewed ABCs of safe sleep. Patient verbalized understanding. Patient requested RN email information on hospital's virtual postpartum classes and support groups. Email sent. Deforest Hoyles, RN, 08/08/21, 1620   ?

## 2022-08-17 IMAGING — US US OB < 14 WEEKS - US OB TV
1 series · 15 of 28 positions shown · non-contrast
Comparison: None.

CLINICAL DATA: 25-year-old pregnant female with abdominal pain.
LMP: 10/28/2020 corresponding to an estimated gestational age of 5
weeks, 1 day.

EXAM:
OBSTETRIC <14 WK US AND TRANSVAGINAL OB US
TECHNIQUE: Both transabdominal and transvaginal ultrasound examinations were
performed for complete evaluation of the gestation as well as the
maternal uterus, adnexal regions, and pelvic cul-de-sac.
Transvaginal technique was performed to assess early pregnancy.

[Series 1: us ob < 14 weeks - us ob tv · 15 of 83 slices shown]
[im 1/83]
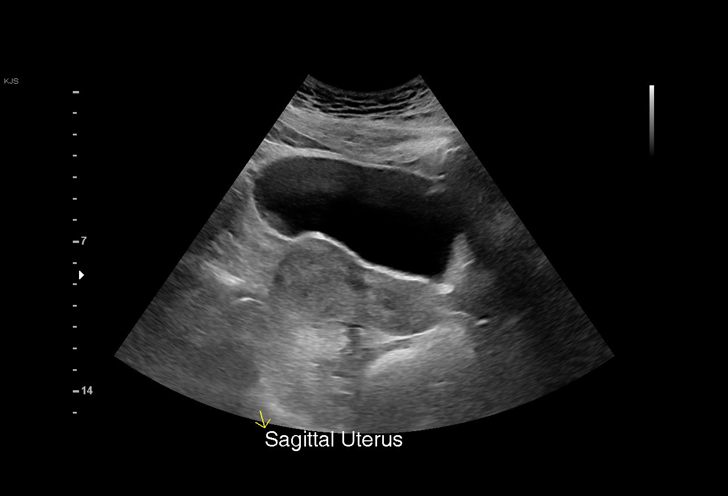
[im 7/83]
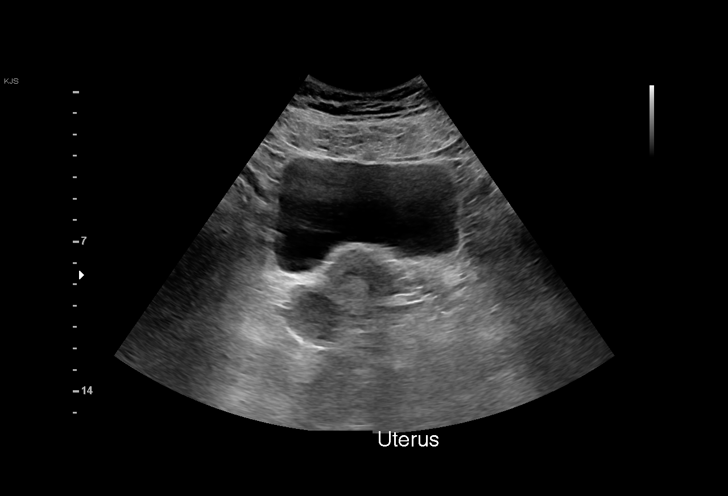
[im 13/83]
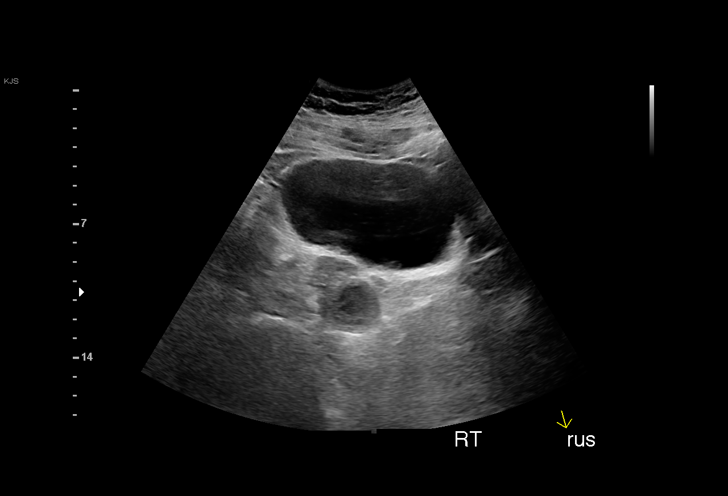
[im 19/83]
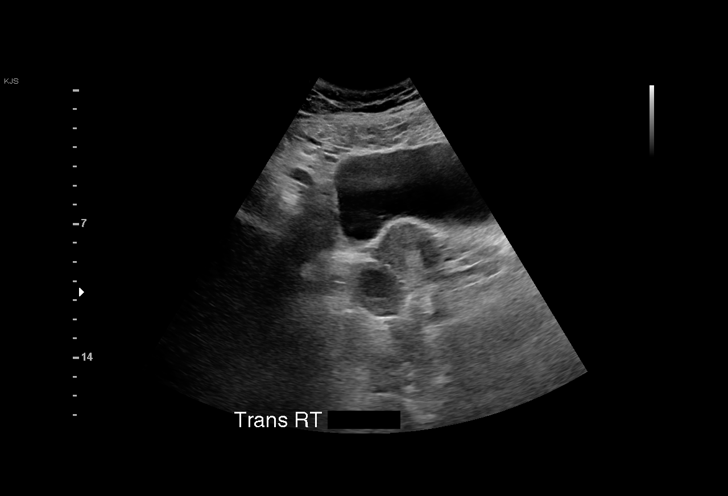
[im 25/83]
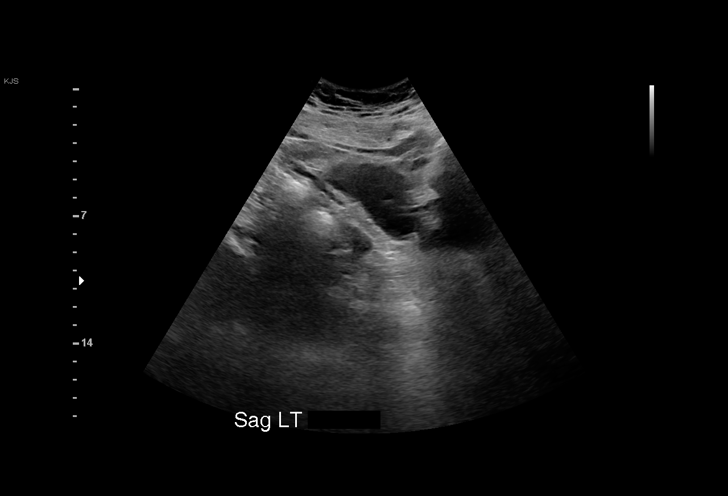
[im 31/83]
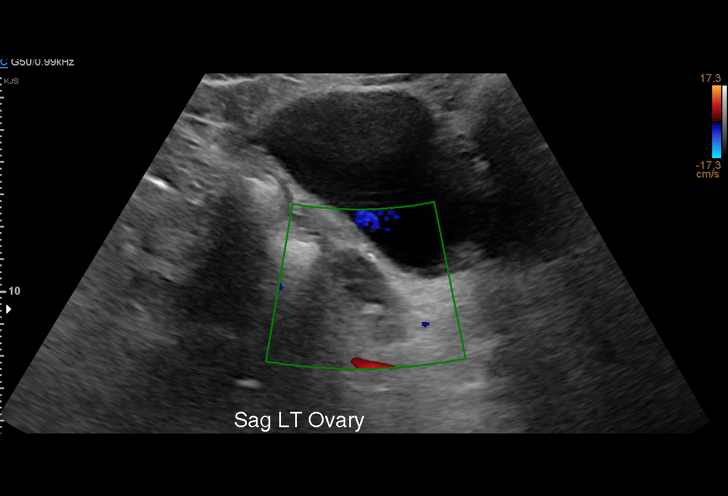
[im 37/83]
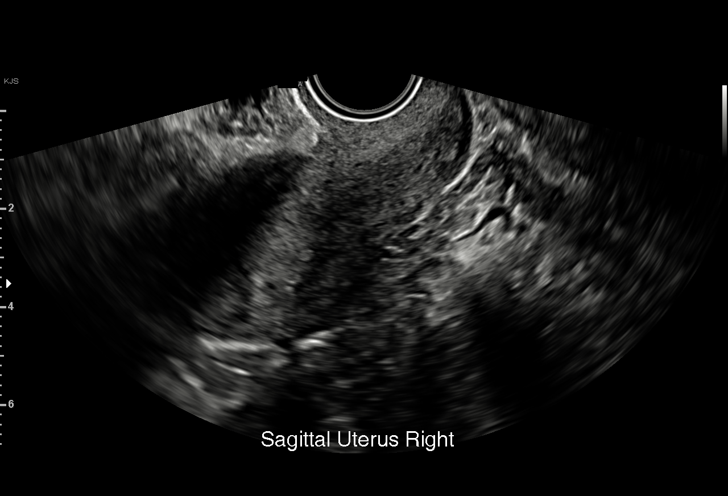
[im 43/83]
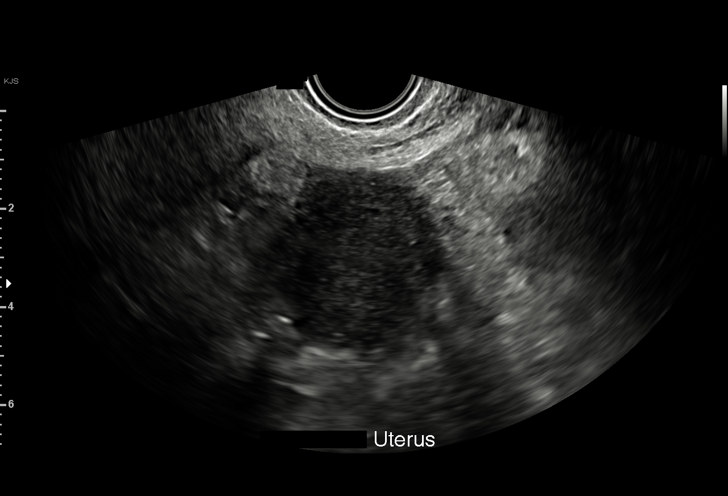
[im 46/83]
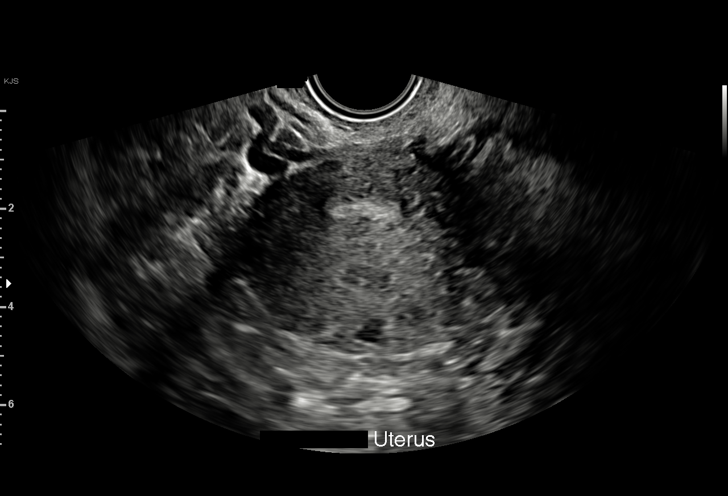
[im 52/83]
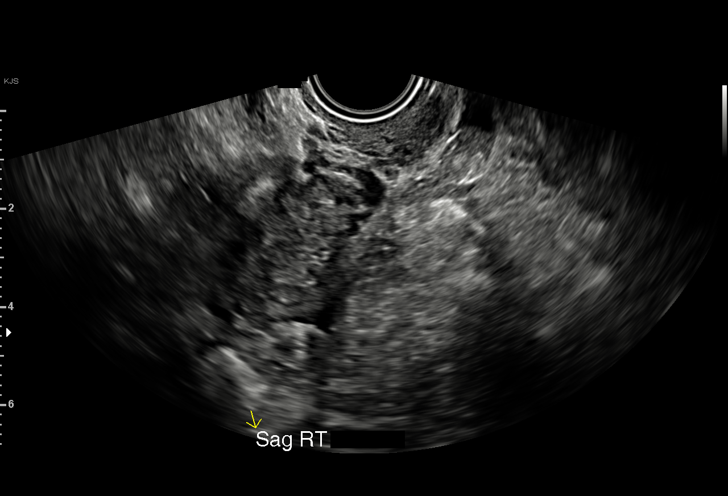
[im 58/83]
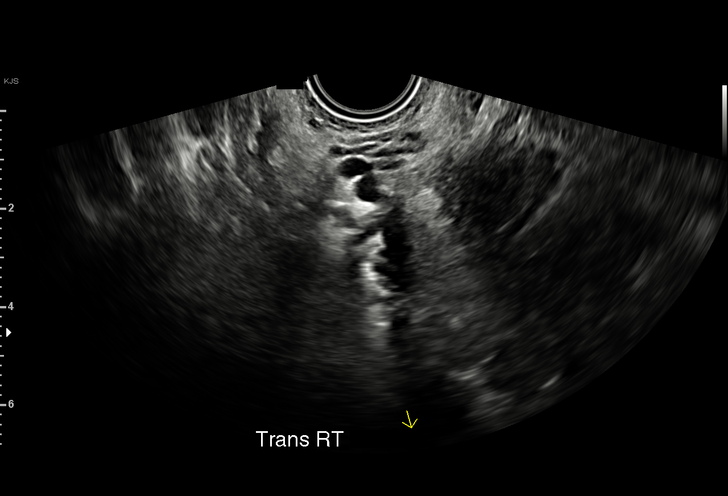
[im 64/83]
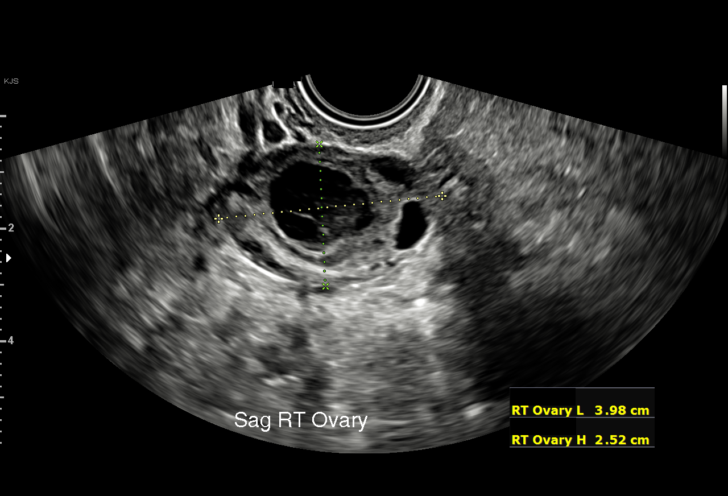
[im 70/83]
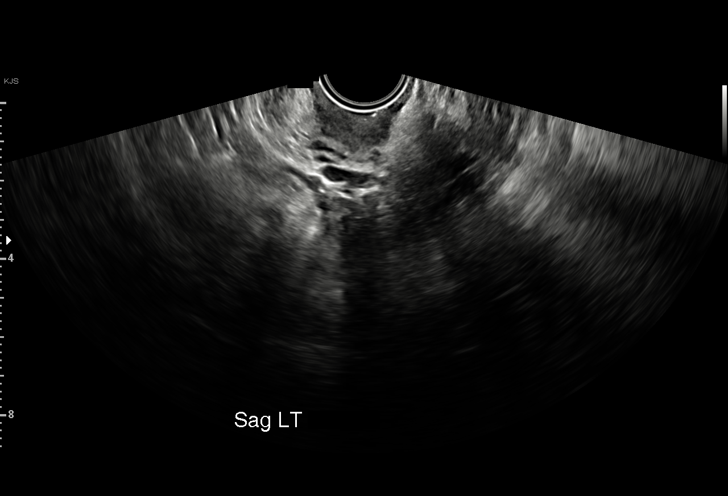
[im 76/83]
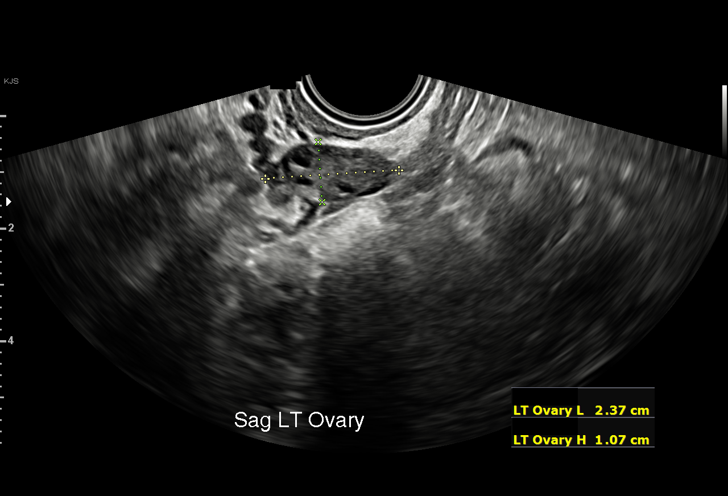
[im 83/83]
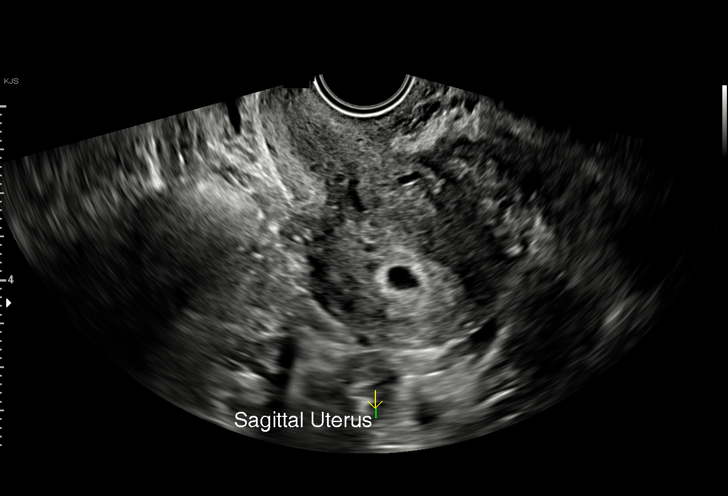

[15 of 28 positions shown; findings below may reference images not displayed]

FINDINGS: Intrauterine gestational sac: Single intrauterine gestational sac.

Yolk sac:  Seen

Embryo:  Not present

Cardiac Activity: N/A

Heart Rate: N/A  bpm

MSD: 9 mm   5 w   4 d

Subchorionic hemorrhage:  None visualized.

Maternal uterus/adnexae: The maternal ovaries are unremarkable.
There is a hemorrhagic corpus luteum in the right ovary.
IMPRESSION: Single intrauterine gestational sac with an estimated gestational
age of 5 weeks, 4 days concordant with age based on LMP. No fetal
pole identified at this time. Follow-up with ultrasound in 7-11
days, or earlier if clinically indicated, recommended.

## 2023-01-03 ENCOUNTER — Other Ambulatory Visit: Payer: Self-pay | Admitting: Family

## 2023-01-03 DIAGNOSIS — N6320 Unspecified lump in the left breast, unspecified quadrant: Secondary | ICD-10-CM

## 2023-01-21 ENCOUNTER — Other Ambulatory Visit: Payer: 59

## 2023-01-21 ENCOUNTER — Ambulatory Visit
Admission: RE | Admit: 2023-01-21 | Discharge: 2023-01-21 | Disposition: A | Discharge status: Home / Self Care | Payer: 59 | Source: Ambulatory Visit | Attending: Family | Admitting: Family

## 2023-01-21 DIAGNOSIS — N6320 Unspecified lump in the left breast, unspecified quadrant: Secondary | ICD-10-CM

## 2023-01-25 ENCOUNTER — Other Ambulatory Visit: Payer: 59

## 2023-01-26 ENCOUNTER — Other Ambulatory Visit: Payer: 59

## 2024-02-16 ENCOUNTER — Other Ambulatory Visit: Payer: Self-pay | Admitting: Obstetrics & Gynecology

## 2024-02-16 DIAGNOSIS — N6452 Nipple discharge: Secondary | ICD-10-CM

## 2024-02-23 ENCOUNTER — Ambulatory Visit
Admission: RE | Admit: 2024-02-23 | Discharge: 2024-02-23 | Disposition: A | Discharge status: Home / Self Care | Source: Ambulatory Visit | Attending: Obstetrics & Gynecology | Admitting: Obstetrics & Gynecology

## 2024-02-23 DIAGNOSIS — N6452 Nipple discharge: Secondary | ICD-10-CM
